# Patient Record
Sex: Male | Born: 1955 | Race: Black or African American | Hispanic: No | Smoking: Former smoker
Health system: Southern US, Community
[De-identification: ages and names within clinical notes are randomized; demographics above are authoritative.]

## PROBLEM LIST (undated history)

## (undated) DIAGNOSIS — M545 Low back pain, unspecified: Secondary | ICD-10-CM

## (undated) DIAGNOSIS — K219 Gastro-esophageal reflux disease without esophagitis: Secondary | ICD-10-CM

## (undated) DIAGNOSIS — I10 Essential (primary) hypertension: Secondary | ICD-10-CM

## (undated) DIAGNOSIS — G4730 Sleep apnea, unspecified: Secondary | ICD-10-CM

## (undated) DIAGNOSIS — E785 Hyperlipidemia, unspecified: Secondary | ICD-10-CM

## (undated) DIAGNOSIS — G500 Trigeminal neuralgia: Secondary | ICD-10-CM

## (undated) HISTORY — PX: TONSILLECTOMY AND ADENOIDECTOMY: SUR1326

## (undated) HISTORY — DX: Sleep apnea, unspecified: G47.30

## (undated) HISTORY — DX: Essential (primary) hypertension: I10

## (undated) HISTORY — DX: Trigeminal neuralgia: G50.0

## (undated) HISTORY — DX: Hyperlipidemia, unspecified: E78.5

## (undated) HISTORY — DX: Low back pain, unspecified: M54.50

## (undated) HISTORY — DX: Gastro-esophageal reflux disease without esophagitis: K21.9

## (undated) HISTORY — DX: Low back pain: M54.5

## (undated) HISTORY — PX: HIATAL HERNIA REPAIR: SHX195

---

## 2001-08-26 ENCOUNTER — Inpatient Hospital Stay (HOSPITAL_COMMUNITY): Admission: EM | Admit: 2001-08-26 | Discharge: 2001-08-29 | Payer: Self-pay | Admitting: Emergency Medicine

## 2001-09-05 ENCOUNTER — Encounter: Admission: RE | Admit: 2001-09-05 | Discharge: 2001-12-04 | Payer: Self-pay | Admitting: Internal Medicine

## 2004-12-17 ENCOUNTER — Ambulatory Visit (HOSPITAL_COMMUNITY): Admission: RE | Admit: 2004-12-17 | Discharge: 2004-12-17 | Payer: Self-pay | Admitting: *Deleted

## 2006-10-06 ENCOUNTER — Encounter: Admission: RE | Admit: 2006-10-06 | Discharge: 2006-10-06 | Payer: Self-pay | Admitting: Internal Medicine

## 2007-06-03 ENCOUNTER — Encounter: Admission: RE | Admit: 2007-06-03 | Discharge: 2007-06-03 | Payer: Self-pay | Admitting: Internal Medicine

## 2007-08-13 ENCOUNTER — Encounter: Admission: RE | Admit: 2007-08-13 | Discharge: 2007-08-13 | Payer: Self-pay

## 2009-01-14 ENCOUNTER — Ambulatory Visit (HOSPITAL_COMMUNITY): Admission: RE | Admit: 2009-01-14 | Discharge: 2009-01-14 | Payer: Self-pay | Admitting: Cardiovascular Disease

## 2009-09-03 ENCOUNTER — Inpatient Hospital Stay (HOSPITAL_COMMUNITY): Admission: EM | Admit: 2009-09-03 | Discharge: 2009-09-04 | Payer: Self-pay | Admitting: Emergency Medicine

## 2010-03-22 ENCOUNTER — Encounter: Admission: RE | Admit: 2010-03-22 | Discharge: 2010-03-22 | Payer: Self-pay | Admitting: Gastroenterology

## 2010-07-27 ENCOUNTER — Ambulatory Visit (HOSPITAL_COMMUNITY)
Admission: RE | Admit: 2010-07-27 | Discharge: 2010-07-27 | Payer: Self-pay | Source: Home / Self Care | Attending: Gastroenterology | Admitting: Gastroenterology

## 2010-08-27 ENCOUNTER — Other Ambulatory Visit (HOSPITAL_COMMUNITY): Payer: Self-pay | Admitting: Surgery

## 2010-08-27 DIAGNOSIS — E1143 Type 2 diabetes mellitus with diabetic autonomic (poly)neuropathy: Secondary | ICD-10-CM

## 2010-08-27 DIAGNOSIS — K3184 Gastroparesis: Secondary | ICD-10-CM

## 2010-08-27 DIAGNOSIS — R1110 Vomiting, unspecified: Secondary | ICD-10-CM

## 2010-08-27 DIAGNOSIS — IMO0001 Reserved for inherently not codable concepts without codable children: Secondary | ICD-10-CM

## 2010-09-09 ENCOUNTER — Other Ambulatory Visit (HOSPITAL_COMMUNITY): Payer: Self-pay

## 2010-09-22 ENCOUNTER — Encounter (HOSPITAL_COMMUNITY)
Admission: RE | Admit: 2010-09-22 | Discharge: 2010-09-22 | Disposition: A | Discharge status: Home / Self Care | Payer: BC Managed Care – PPO | Source: Ambulatory Visit | Attending: Surgery | Admitting: Surgery

## 2010-09-22 DIAGNOSIS — IMO0001 Reserved for inherently not codable concepts without codable children: Secondary | ICD-10-CM

## 2010-09-22 DIAGNOSIS — E1149 Type 2 diabetes mellitus with other diabetic neurological complication: Secondary | ICD-10-CM | POA: Insufficient documentation

## 2010-09-22 DIAGNOSIS — E1143 Type 2 diabetes mellitus with diabetic autonomic (poly)neuropathy: Secondary | ICD-10-CM

## 2010-09-22 DIAGNOSIS — K219 Gastro-esophageal reflux disease without esophagitis: Secondary | ICD-10-CM | POA: Insufficient documentation

## 2010-09-22 DIAGNOSIS — R1110 Vomiting, unspecified: Secondary | ICD-10-CM

## 2010-09-22 DIAGNOSIS — K3184 Gastroparesis: Secondary | ICD-10-CM

## 2010-09-22 MED ORDER — TECHNETIUM TC 99M SULFUR COLLOID
2.0000 | Freq: Once | INTRAVENOUS | Status: AC | PRN
  Administered 2010-09-22: 2 via INTRAVENOUS

## 2010-09-27 LAB — HEMOGLOBIN A1C
Hgb A1c MFr Bld: 8.5 % — ABNORMAL HIGH (ref 4.6–6.1)
Mean Plasma Glucose: 197 mg/dL

## 2010-09-27 LAB — CBC
HCT: 44.1 % (ref 39.0–52.0)
HCT: 47.9 % (ref 39.0–52.0)
Hemoglobin: 14.5 g/dL (ref 13.0–17.0)
Hemoglobin: 15.5 g/dL (ref 13.0–17.0)
MCHC: 32.3 g/dL (ref 30.0–36.0)
MCHC: 32.8 g/dL (ref 30.0–36.0)
MCV: 80.8 fL (ref 78.0–100.0)
MCV: 81.6 fL (ref 78.0–100.0)
Platelets: 142 10*3/uL — ABNORMAL LOW (ref 150–400)
Platelets: 144 10*3/uL — ABNORMAL LOW (ref 150–400)
RBC: 5.41 MIL/uL (ref 4.22–5.81)
RBC: 5.93 MIL/uL — ABNORMAL HIGH (ref 4.22–5.81)
RDW: 13.3 % (ref 11.5–15.5)
RDW: 13.3 % (ref 11.5–15.5)
WBC: 4.5 10*3/uL (ref 4.0–10.5)
WBC: 5 10*3/uL (ref 4.0–10.5)

## 2010-09-27 LAB — GLUCOSE, CAPILLARY
Glucose-Capillary: 182 mg/dL — ABNORMAL HIGH (ref 70–99)
Glucose-Capillary: 200 mg/dL — ABNORMAL HIGH (ref 70–99)
Glucose-Capillary: 224 mg/dL — ABNORMAL HIGH (ref 70–99)
Glucose-Capillary: 256 mg/dL — ABNORMAL HIGH (ref 70–99)
Glucose-Capillary: 70 mg/dL (ref 70–99)

## 2010-09-27 LAB — MAGNESIUM: Magnesium: 2.1 mg/dL (ref 1.5–2.5)

## 2010-09-27 LAB — LIPID PANEL
Cholesterol: 156 mg/dL (ref 0–200)
HDL: 40 mg/dL (ref >39)
LDL Cholesterol: 88 mg/dL (ref 0–99)
Total CHOL/HDL Ratio: 3.9 RATIO
Triglycerides: 142 mg/dL (ref <150)
VLDL: 28 mg/dL (ref 0–40)

## 2010-09-27 LAB — DIFFERENTIAL
Basophils Absolute: 0 10*3/uL (ref 0.0–0.1)
Basophils Relative: 1 % (ref 0–1)
Eosinophils Absolute: 0 10*3/uL (ref 0.0–0.7)
Eosinophils Relative: 1 % (ref 0–5)
Lymphocytes Relative: 37 % (ref 12–46)
Lymphs Abs: 1.7 10*3/uL (ref 0.7–4.0)
Monocytes Absolute: 0.5 10*3/uL (ref 0.1–1.0)
Monocytes Relative: 11 % (ref 3–12)
Neutro Abs: 2.3 10*3/uL (ref 1.7–7.7)
Neutrophils Relative %: 50 % (ref 43–77)

## 2010-09-27 LAB — CARDIAC PANEL(CRET KIN+CKTOT+MB+TROPI)
CK, MB: 4.3 ng/mL — ABNORMAL HIGH (ref 0.3–4.0)
CK, MB: 4.8 ng/mL — ABNORMAL HIGH (ref 0.3–4.0)
Relative Index: 1.5 (ref 0.0–2.5)
Relative Index: 1.6 (ref 0.0–2.5)
Total CK: 267 U/L — ABNORMAL HIGH (ref 7–232)
Total CK: 317 U/L — ABNORMAL HIGH (ref 7–232)
Troponin I: 0.01 ng/mL (ref 0.00–0.06)
Troponin I: <0.01 ng/mL (ref 0.00–0.06)

## 2010-09-27 LAB — BASIC METABOLIC PANEL
BUN: 10 mg/dL (ref 6–23)
CO2: 29 mEq/L (ref 19–32)
Calcium: 9.4 mg/dL (ref 8.4–10.5)
Chloride: 102 mEq/L (ref 96–112)
Creatinine, Ser: 0.94 mg/dL (ref 0.4–1.5)
GFR calc Af Amer: >60 mL/min (ref >60)
GFR calc non Af Amer: >60 mL/min (ref >60)
Glucose, Bld: 149 mg/dL — ABNORMAL HIGH (ref 70–99)
Potassium: 3.8 mEq/L (ref 3.5–5.1)
Sodium: 137 mEq/L (ref 135–145)

## 2010-09-27 LAB — CK TOTAL AND CKMB (NOT AT ARMC)
CK, MB: 5.6 ng/mL — ABNORMAL HIGH (ref 0.3–4.0)
Relative Index: 1.5 (ref 0.0–2.5)
Total CK: 377 U/L — ABNORMAL HIGH (ref 7–232)

## 2010-09-27 LAB — COMPREHENSIVE METABOLIC PANEL
ALT: 45 U/L (ref 0–53)
AST: 27 U/L (ref 0–37)
Albumin: 4.3 g/dL (ref 3.5–5.2)
Alkaline Phosphatase: 77 U/L (ref 39–117)
BUN: 8 mg/dL (ref 6–23)
CO2: 27 mEq/L (ref 19–32)
Calcium: 9.5 mg/dL (ref 8.4–10.5)
Chloride: 105 mEq/L (ref 96–112)
Creatinine, Ser: 0.92 mg/dL (ref 0.4–1.5)
GFR calc Af Amer: >60 mL/min (ref >60)
GFR calc non Af Amer: >60 mL/min (ref >60)
Glucose, Bld: 98 mg/dL (ref 70–99)
Potassium: 3.7 mEq/L (ref 3.5–5.1)
Sodium: 141 mEq/L (ref 135–145)
Total Bilirubin: 0.7 mg/dL (ref 0.3–1.2)
Total Protein: 7.3 g/dL (ref 6.0–8.3)

## 2010-09-27 LAB — PROTIME-INR
INR: 0.99 (ref 0.00–1.49)
Prothrombin Time: 13 seconds (ref 11.6–15.2)

## 2010-09-27 LAB — TROPONIN I: Troponin I: 0.01 ng/mL (ref 0.00–0.06)

## 2010-09-27 LAB — HEPARIN LEVEL (UNFRACTIONATED)
Heparin Unfractionated: 0.26 IU/mL — ABNORMAL LOW (ref 0.30–0.70)
Heparin Unfractionated: 0.31 IU/mL (ref 0.30–0.70)

## 2010-09-27 LAB — POCT CARDIAC MARKERS
CKMB, poc: 2.9 ng/mL (ref 1.0–8.0)
Myoglobin, poc: 124 ng/mL (ref 12–200)
Troponin i, poc: <0.05 ng/mL (ref 0.00–0.09)

## 2010-09-27 LAB — MRSA PCR SCREENING: MRSA by PCR: NEGATIVE

## 2010-09-27 LAB — TSH: TSH: 1.993 u[IU]/mL (ref 0.350–4.500)

## 2010-09-27 LAB — APTT: aPTT: 35 seconds (ref 24–37)

## 2010-11-02 ENCOUNTER — Encounter (INDEPENDENT_AMBULATORY_CARE_PROVIDER_SITE_OTHER): Payer: Self-pay | Admitting: Surgery

## 2010-11-15 ENCOUNTER — Ambulatory Visit (HOSPITAL_COMMUNITY): Admission: RE | Admit: 2010-11-15 | Payer: BC Managed Care – PPO | Source: Ambulatory Visit | Admitting: Surgery

## 2010-11-19 NOTE — Op Note (Signed)
[name], [name] NO.:  [id number]   MEDICAL RECORD NO.:  [id number]          PATIENT TYPE:  AMB   LOCATION:  ENDO                         FACILITY:  [facility]   PHYSICIAN:  Georgiana Spinner, M.D.    DATE OF BIRTH:  [birth year]   DATE OF PROCEDURE:  12/17/2004  DATE OF DISCHARGE:                                 OPERATIVE REPORT   PROCEDURE:  Colonoscopy.   INDICATIONS:  Rectal bleeding   ANESTHESIA:  Demerol 60, Versed 6 mg.   PROCEDURE:  With the patient mildly sedated in the left lateral decubitus  position, the Olympus videoscopic colonoscope was inserted in the rectum and  after normal rectal exam and passed under direct vision to the cecum,  identified by the base of the cecum and ileocecal valve.  Of note, the  patient had a fair amount of fecal debris that could not be suctioned  because it was particulate, and we rolled the patient from one side to the  other to look at the cecum and move the debris which we did.  Subsequently,  the colonoscope was then slowly withdrawn taking circumferential views of  colonic mucosa, stopping to suction and clean as we went until we reached  the rectum which appeared normal on direct and retroflexed view.  The  endoscope was straightened, withdrawn.  The patient's vital signs, pulse  oximeter remained stable.  The patient tolerated procedure well without  apparent complications.   FINDINGS:  Unremarkable examination limited somewhat by the prep but no  gross lesions seen.   PLAN:  Consider repeat examination possibly in 5 years.       GMO/MEDQ  D:  12/17/2004  T:  12/17/2004  Job:  [id number]

## 2010-11-19 NOTE — H&P (Signed)
[address]  Patient:    [name], [place] [name] Visit Number: [id number] MRN: [id number]          Service Type: MED Location: 1E 0104 01 Attending Physician:  Devoria Albe Dictated by:   Soyla Murphy. Renne Crigler, M.D. Admit Date:  08/26/2001   CC:         Janae Bridgeman. Eloise Harman., M.D.   History and Physical  DATE OF BIRTH:  1955-11-10  ADMISSION HISTORY AND PHYSICAL:  [place]ard [name] is a 55 year old single resident of [place] admitted with a chief complaint of weakness.  He said that it started about a week ago.  He felt exhausted and was drinking a lot of fluids.  He was seen in the office by Victorino Dike on Thursday, he was told to come back and have labs on Friday and did not hear the results from those.  Yesterday he felt terrible.  Today he was no better so he came to the emergency room.  He has had no cough, fevers, chills, dysuria, or syncope.  PAST HISTORY:  Hypertension.  He has not had surgery.  MEDICATIONS: 1. Cardura one tablet q.a.m., 1-1/2 tablets q.p.m. 2. Prevacid 30 mg p.o. daily. 3. Multivitamin one daily. 4. Ibuprofen p.r.n. 5. Zocor unknown amount q.p.m.  ALLERGIES:  No known allergies.  PERSONAL HISTORY:  Originally from [place].  He works as a Science writer.  He has an occasional beer on weekends but no tobacco.  He quit smoking three years ago.  FAMILY HISTORY:  There is no diabetes in the family.  Father died of "natural causes."  Mother is alive and well, two children alive and well.  REVIEW OF SYSTEMS:  He has no current numbness, tingling, or weakness, seizures, syncope, headache, sinus pain, ear pain, change in hearing, or change in vision.  There is no runny nose or sore throat.  No trouble swallowing.  No nausea, vomiting, diarrhea, constipation, blood in stool, or black stools.  He denies any chest pain, cough, or shortness of breath, although he states he does tire more easily than usual.  He has had no  change in skin and no temperature intolerance.  There has been no change in urination and he has had excessive urination for the past week but no burning with urination and no blood has been noted.  No abdominal pain.  No joint or muscle aches.  The vision has been blurry at times.  He did have a little rectal bleeding a month or two ago.  PHYSICAL EXAMINATION:  GENERAL:  He is a thin-appearing gentleman in no acute distress.  VITAL SIGNS:  Blood pressure is 116/82, pulse 107, respirations 18, temperature is 96.9.  SKIN:  Within normal limits.  NECK:  There is no cervical, supraclavicular, axillary, or inguinal adenopathy.  Neck is supple.  There is no thyromegaly and no neck masses.  HEENT:  Normocephalic, atraumatic, conjunctivae normal, pupils small, round, reactive to light, equal.  TMs and ear canals are normal.  Tongue and posterior pharynx appear normal.  LUNGS:  Clear.  HEART:  Regular rate and rhythm.  No murmurs, rubs, or gallops.  No JVD, no edema.  ABDOMEN:  Nontender, bowel sounds present, no organomegaly, no masses.  GENITALIA:  Normal circumcised male genitalia with normal testicles.  No hernias.  RECTAL:  Normal rectal tone, no rectal masses.  Stools brown and Heme-negative.  Prostate feels normal.  EXTREMITIES:  No cyanosis, clubbing, or edema.  Good muscle mass.  NEUROLOGIC:  Alert,  oriented x3, cranial nerves II-XII intact.  Equal muscle strength in both lower extremities including EHL with 1+ equal deep tendon reflexes in knees.  Normal speech.  Gait is not tested at this time.  IMPRESSION: 1. New onset diabetes with diabetic ketoacidosis.  Will treat with hydration    and place on _________  to get blood sugars down.  Adjust IV fluid as    indicated.  Diabetic teaching while in hospital. 2. New, recent rectal bleeding.  Recommended GI consult to _________ patient. 3. Hypertension.  Change Cardura to Altace and for health maintenance will    check  PPD and give him a Pneumovax and check HIV. Dictated by:   Soyla Murphy. Renne Crigler, M.D. Attending Physician:  Devoria Albe DD:  08/26/01 TD:  08/26/01 Job: [id number]

## 2010-11-19 NOTE — Discharge Summary (Signed)
[facility]  Patient:    Howard [name], Howard [name] Visit Number: [id number] MRN: [id number]          Service Type: MED Location: 3W 0380 01 Attending Physician:  Vashti Hey Dictated by:   Janae Bridgeman Eloise Harman., M.D. Admit Date:  08/26/2001 Discharge Date: 08/29/2001                             Discharge Summary  DATE OF BIRTH:  12/31/1955  FINAL DIAGNOSES: 1. Diabetic ketoacidosis with new onset diabetes mellitus. 2. Hypertension. 3. Gastroesophageal reflux disease. 4. Hyperlipidemia.  HISTORY OF PRESENT ILLNESS:  This 55 year old black male, [name] by birth, presents with about one week of increasing fatigue, polydipsia, polyuria, malaise, about a 25 to 30 pound weight loss over the past two to three months. He had presented to the office just prior to this presentation with these complaints, and laboratory studies were drawn, but not yet returned for assessment.  On presentation to the emergency room the patients blood sugar was found to be in excess at 600, and he had some ketonuria.  He was promptly admitted to the intensive care unit for aggressive management of this acute onset of diabetic ketoacidosis.  HOSPITAL COURSE:  The patient was initially placed on an insulin drip via the glucomander protocol.  He had prompt response, and his capillary blood sugar the following morning was 160 mg%.  Insulin was continued by sliding scale, and initiated with NPH in the morning.  Subsequently, Lantus was added at bedtime, and the NPH in the morning was switched to 75/25 Humalog.  He was educated by the diabetic educator.  He had no untoward complications initially, and was subsequently transferred out of the intensive care unit to the regular floor for further management, hydration, and diabetic teaching. By the morning of 08/29/01, he was still receiving diabetic instruction, however, it was felt that further monitoring and treatment could  be accomplished safely on an outpatient basis.  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  He will check his records and see if he has previously received BCG vaccine in his [name], otherwise, he may be a candidate for [facility] for the next six months.  A pneumovax vaccine was ordered, however, it is not clear if this was administered at the time of this dictation.  LABORATORY DATA:  The patients blood sugar in the emergency room was 624. Sodium was 127, potassium 5.5, chloride 93, CO2 16 in the emergency room. This dictation is not being very cooperative, I cannot get the machinery here to work.  Subsequent sodiums were up to 137, potassium remained normal at 3.7, CO2 was steadily rising up to 21.  Glucose steadily decreasing in the range of 156 to 270.  His initial bilirubin was slightly elevated at 1.6.  Serum acetone initially was small.  His urine initially showed urine with greater than 1000 mg% of glucose, greater than 80 mg% of ketones.  His EKG showed a normal sinus rhythm with left atrial enlargement, possibly early repolarization abnormality.  CONSULTATIONS:  None.  OPERATIONS:  None.  TRANSFUSIONS:  None.  RECOMMENDATIONS ON DISCHARGE:  The patient was advised that he indeed was a new onset diabetic, and that for the foreseeable future he would need insulin self-administered and monitored closely in the office.  He would receive some Lantus insulin 20 units at bedtime, and a Humalog mixture 75/25  in the morning, 30 units unless otherwise directed.  He would check his capillary glucose readings every mealtime, and administer his sliding scale according to protocol.  FURTHER DISCHARGE MEDICATIONS: 1. His usual acid blocker, Prevacid 30 mg q.d. 2. Multivitamin q.d. 3. Aspirin 81 mg q.d. 4. He is also started on Altace 5 mg q.d.  He would monitor his capillary glucose before meals and bedtime, until his return to the office in about  10 days.  ACTIVITY:  Remain out of work until this next Monday.  Return to work if he is physically able.  FOLLOWUP:  Return for further diabetic teaching through the diabetic teaching service as an outpatient.  CONDITION ON DISCHARGE:  Greatly improved.  DIET:  He will follow a 2400 calorie diabetic diet.Dictated by:   Janae Bridgeman Eloise Harman., M.D. Attending Physician:  Vashti Hey DD:  08/29/01 TD:  08/29/01 Job: [id number]

## 2011-01-16 ENCOUNTER — Emergency Department (HOSPITAL_COMMUNITY)
Admission: EM | Admit: 2011-01-16 | Discharge: 2011-01-16 | Disposition: A | Discharge status: Home / Self Care | Payer: BC Managed Care – PPO | Attending: Emergency Medicine | Admitting: Emergency Medicine

## 2011-01-16 ENCOUNTER — Emergency Department (HOSPITAL_COMMUNITY): Payer: BC Managed Care – PPO

## 2011-01-16 DIAGNOSIS — I10 Essential (primary) hypertension: Secondary | ICD-10-CM | POA: Insufficient documentation

## 2011-01-16 DIAGNOSIS — M5412 Radiculopathy, cervical region: Secondary | ICD-10-CM | POA: Insufficient documentation

## 2011-01-16 DIAGNOSIS — IMO0002 Reserved for concepts with insufficient information to code with codable children: Secondary | ICD-10-CM | POA: Insufficient documentation

## 2011-01-16 DIAGNOSIS — E119 Type 2 diabetes mellitus without complications: Secondary | ICD-10-CM | POA: Insufficient documentation

## 2011-01-16 DIAGNOSIS — F411 Generalized anxiety disorder: Secondary | ICD-10-CM | POA: Insufficient documentation

## 2011-01-17 ENCOUNTER — Other Ambulatory Visit: Payer: Self-pay | Admitting: Physical Medicine and Rehabilitation

## 2011-01-17 DIAGNOSIS — M5030 Other cervical disc degeneration, unspecified cervical region: Secondary | ICD-10-CM

## 2011-01-18 ENCOUNTER — Other Ambulatory Visit: Payer: Self-pay | Admitting: Physical Medicine and Rehabilitation

## 2011-01-18 DIAGNOSIS — M5030 Other cervical disc degeneration, unspecified cervical region: Secondary | ICD-10-CM

## 2011-01-19 ENCOUNTER — Ambulatory Visit
Admission: RE | Admit: 2011-01-19 | Discharge: 2011-01-19 | Disposition: A | Discharge status: Home / Self Care | Payer: BC Managed Care – PPO | Source: Ambulatory Visit | Attending: Physical Medicine and Rehabilitation | Admitting: Physical Medicine and Rehabilitation

## 2011-01-19 ENCOUNTER — Other Ambulatory Visit: Payer: Self-pay

## 2011-01-19 DIAGNOSIS — M5030 Other cervical disc degeneration, unspecified cervical region: Secondary | ICD-10-CM

## 2011-01-19 MED ORDER — METHYLPREDNISOLONE ACETATE 40 MG/ML INJ SUSP (RADIOLOG
120.0000 mg | Freq: Once | INTRAMUSCULAR | Status: AC
  Administered 2011-01-19: 120 mg via EPIDURAL

## 2011-01-19 MED ORDER — IOHEXOL 180 MG/ML  SOLN
1.0000 mL | Freq: Once | INTRAMUSCULAR | Status: AC | PRN
  Administered 2011-01-19: 1 mL via EPIDURAL

## 2011-02-17 ENCOUNTER — Ambulatory Visit (HOSPITAL_COMMUNITY)
Admission: RE | Admit: 2011-02-17 | Discharge: 2011-02-17 | Disposition: A | Discharge status: Home / Self Care | Payer: BC Managed Care – PPO | Source: Ambulatory Visit | Attending: Surgery | Admitting: Surgery

## 2011-02-17 ENCOUNTER — Other Ambulatory Visit (HOSPITAL_COMMUNITY): Payer: BC Managed Care – PPO

## 2011-02-17 ENCOUNTER — Telehealth (INDEPENDENT_AMBULATORY_CARE_PROVIDER_SITE_OTHER): Payer: Self-pay

## 2011-02-17 ENCOUNTER — Other Ambulatory Visit (INDEPENDENT_AMBULATORY_CARE_PROVIDER_SITE_OTHER): Payer: Self-pay | Admitting: Surgery

## 2011-02-17 ENCOUNTER — Encounter (HOSPITAL_COMMUNITY): Payer: BC Managed Care – PPO

## 2011-02-17 DIAGNOSIS — K449 Diaphragmatic hernia without obstruction or gangrene: Secondary | ICD-10-CM | POA: Insufficient documentation

## 2011-02-17 DIAGNOSIS — Z01818 Encounter for other preprocedural examination: Secondary | ICD-10-CM

## 2011-02-17 DIAGNOSIS — E119 Type 2 diabetes mellitus without complications: Secondary | ICD-10-CM | POA: Insufficient documentation

## 2011-02-17 DIAGNOSIS — Z01812 Encounter for preprocedural laboratory examination: Secondary | ICD-10-CM | POA: Insufficient documentation

## 2011-02-17 DIAGNOSIS — I10 Essential (primary) hypertension: Secondary | ICD-10-CM | POA: Insufficient documentation

## 2011-02-17 LAB — CBC
HCT: 47.2 % (ref 39.0–52.0)
Hemoglobin: 15.1 g/dL (ref 13.0–17.0)
MCH: 25.9 pg — ABNORMAL LOW (ref 26.0–34.0)
MCHC: 32 g/dL (ref 30.0–36.0)
MCV: 81.1 fL (ref 78.0–100.0)
Platelets: 150 10*3/uL (ref 150–400)
RBC: 5.82 MIL/uL — ABNORMAL HIGH (ref 4.22–5.81)
RDW: 13.4 % (ref 11.5–15.5)
WBC: 5.7 10*3/uL (ref 4.0–10.5)

## 2011-02-17 LAB — BASIC METABOLIC PANEL
BUN: 12 mg/dL (ref 6–23)
CO2: 28 mEq/L (ref 19–32)
Calcium: 10.1 mg/dL (ref 8.4–10.5)
Chloride: 103 mEq/L (ref 96–112)
Creatinine, Ser: 1 mg/dL (ref 0.50–1.35)
GFR calc Af Amer: >60 mL/min (ref >60)
GFR calc non Af Amer: >60 mL/min (ref >60)
Glucose, Bld: 204 mg/dL — ABNORMAL HIGH (ref 70–99)
Potassium: 4.1 mEq/L (ref 3.5–5.1)
Sodium: 140 mEq/L (ref 135–145)

## 2011-02-17 LAB — SURGICAL PCR SCREEN
MRSA, PCR: NEGATIVE
Staphylococcus aureus: NEGATIVE

## 2011-02-17 NOTE — Telephone Encounter (Signed)
Albin Felling called and stated she saw the pt for preop and he had just seen his primary md for a sinus infection or head cold.  He was started on a Z-pack.   He has no fever today.  Surgery is 8/23.  Albin Felling told him to call us if no better before surgery

## 2011-02-17 NOTE — Telephone Encounter (Signed)
Returned pt's message about starting a Z-Pack this wk for sinus infection and Dr Michaell Cowing notified. Pt advised if start to run fever or develop a cough he would need to let us know before his surgery on 02-24-11 b/c we might need to postpone surgery. The pt will let us know if any changes should occur but he is feeling much better being on the z-pack./ [facility]

## 2011-02-24 ENCOUNTER — Ambulatory Visit (HOSPITAL_COMMUNITY)
Admission: RE | Admit: 2011-02-24 | Discharge: 2011-02-25 | Disposition: A | Discharge status: Home / Self Care | Payer: BC Managed Care – PPO | Source: Ambulatory Visit | Attending: Surgery | Admitting: Surgery

## 2011-02-24 DIAGNOSIS — K2100 Gastro-esophageal reflux disease with esophagitis, without bleeding: Secondary | ICD-10-CM

## 2011-02-24 DIAGNOSIS — G4733 Obstructive sleep apnea (adult) (pediatric): Secondary | ICD-10-CM | POA: Insufficient documentation

## 2011-02-24 DIAGNOSIS — Z79899 Other long term (current) drug therapy: Secondary | ICD-10-CM | POA: Insufficient documentation

## 2011-02-24 DIAGNOSIS — Z794 Long term (current) use of insulin: Secondary | ICD-10-CM | POA: Insufficient documentation

## 2011-02-24 DIAGNOSIS — K449 Diaphragmatic hernia without obstruction or gangrene: Secondary | ICD-10-CM

## 2011-02-24 DIAGNOSIS — K219 Gastro-esophageal reflux disease without esophagitis: Secondary | ICD-10-CM | POA: Insufficient documentation

## 2011-02-24 DIAGNOSIS — E119 Type 2 diabetes mellitus without complications: Secondary | ICD-10-CM | POA: Insufficient documentation

## 2011-02-24 DIAGNOSIS — Z7982 Long term (current) use of aspirin: Secondary | ICD-10-CM | POA: Insufficient documentation

## 2011-02-24 DIAGNOSIS — I10 Essential (primary) hypertension: Secondary | ICD-10-CM | POA: Insufficient documentation

## 2011-02-24 HISTORY — PX: ESOPHAGOGASTRIC FUNDOPLICATION: SHX405

## 2011-02-24 LAB — GLUCOSE, CAPILLARY
Glucose-Capillary: 118 mg/dL — ABNORMAL HIGH (ref 70–99)
Glucose-Capillary: 178 mg/dL — ABNORMAL HIGH (ref 70–99)
Glucose-Capillary: 191 mg/dL — ABNORMAL HIGH (ref 70–99)
Glucose-Capillary: 237 mg/dL — ABNORMAL HIGH (ref 70–99)
Glucose-Capillary: 252 mg/dL — ABNORMAL HIGH (ref 70–99)

## 2011-02-25 ENCOUNTER — Ambulatory Visit (HOSPITAL_COMMUNITY): Payer: BC Managed Care – PPO

## 2011-02-25 LAB — GLUCOSE, CAPILLARY
Glucose-Capillary: 116 mg/dL — ABNORMAL HIGH (ref 70–99)
Glucose-Capillary: 202 mg/dL — ABNORMAL HIGH (ref 70–99)
Glucose-Capillary: 84 mg/dL (ref 70–99)

## 2011-02-25 MED ORDER — IOHEXOL 300 MG/ML  SOLN
50.0000 mL | Freq: Once | INTRAMUSCULAR | Status: AC | PRN
  Administered 2011-02-25: 50 mL via ORAL

## 2011-02-28 ENCOUNTER — Telehealth (INDEPENDENT_AMBULATORY_CARE_PROVIDER_SITE_OTHER): Payer: Self-pay | Admitting: Surgery

## 2011-02-28 LAB — GLUCOSE, CAPILLARY: Glucose-Capillary: 141 mg/dL — ABNORMAL HIGH (ref 70–99)

## 2011-03-03 NOTE — Op Note (Signed)
NAMEWILLIOM, Howard Cook NO.:  [id number]  MEDICAL RECORD NO.:  [id number]  LOCATION:  1538                         FACILITY:  [facility]  PHYSICIAN:  Ardeth Sportsman, MD     DATE OF BIRTH:  1956/02/03  DATE OF PROCEDURE:  02/24/2011 DATE OF DISCHARGE:                              OPERATIVE REPORT   PRIMARY CARE PHYSICIAN:  Georgianne Fick, M.D.  GASTROENTEROLOGIST:  Jordan Hawks. Elnoria Howard, M.D.  ENT SURGEON:  Kristine Garbe. Ezzard Standing, M.D.  PULMONOLOGIST:  Kerry Kass, M.D. [facility].  OPERATING SURGEON:  Ardeth Sportsman, MD.  ASSISTANT:  Sandria Bales. Ezzard Standing, M.D.  PREOPERATIVE DIAGNOSIS:  Worsening gastroesophageal reflux disease in the setting of a sliding paraesophageal hiatal hernia.  POSTOPERATIVE DIAGNOSIS:  Worsening gastroesophageal reflux disease in the setting of a sliding paraesophageal hiatal hernia.  PROCEDURE PERFORMED: 1. Laparoscopic type 2 mediastinal dissection. 2. Laparoscopic primary paraesophageal hiatal hernia repair with     pledgets. 3. Laparoscopic Nissen fundoplication (360-degree wrap, over 56-French     bougie, 2 cm length).  SPECIMENS:  None.  DRAINS:  None.  ESTIMATED BLOOD LOSS:  30 mL.  COMPLICATIONS:  None apparent.  INDICATIONS:  Mr. Stillinger is a 55 year old gentleman, originally from [place], but in the [place] for several decades who has had worsening problems with foul taste and odor, and allergic and ENT workup negative for any major disorders with resolved sinusitis.  He has evidence of reflux on a Bravo pH study and a hiatal hernia as well.  He has been on maximal medical therapy without palliation of his symptoms.  The anatomy and physiology of the foregut was discussed. Pathophysiology of the reflux including differential diagnosis and symptoms were discussed.  Recommendations made for fundoplication and repair of paraesophageal hiatal hernia.  Risks, benefits, alternatives were discussed in detail.  Postoperative diet  and other risks were discussed in detail.  I had a long discussion with him numerous times. He expressed understanding and wished to proceed.  OPERATIVE FINDINGS:  He had a somewhat inflamed shortened esophagus.  He had a sliding paraesophageal hiatal hernia without a massive hiatal defect.  The wrap was as noted above.  DESCRIPTION OF PROCEDURE:  Informed consent was confirmed.  The patient underwent general anesthesia without any difficulty.  He was positioned on a split leg with arms tucked.  A Foley catheter was sterilely placed. He had sequential compression devices active during the entire case. His abdomen and mons pubis were clipped, prepped, and draped in the sterile fashion.  A surgical time-out confirmed the plan.  He received IV cefazolin.  I placed a #5 mm port in the left upper quadrant, the paramedian region using optical entry technique with a 5 mm 0 scope.  Entry was clean.  I induced carbon dioxide insufflation.  Camera inspection revealed no injury.  Under direct visualization, I placed a #10 mm port in the left subcostal ridge near the lateral clavicular line.  I placed another 5 mm port in the right upper quadrant.  I placed a 5 mm port in the right subxiphoid region under direct visualization.  I removed that and placed a rigid omega-shaped Nathanson liver retractor to help secure  the left lateral sector of the liver anteriorly to better expose the esophageal hiatus.  It was secured to the table using an iron man system.  I placed a 5 mm port in the right subxiphoid region.  Inspection noted some mild inflammation in the region.  I went ahead and ligated the hepatogastric ligament using Harmonic ultrasonic dissection. I was able to follow that, I followed that, and I identified the right cruise.  I was able to enter between the right cruise and the esophagus in the anterior mediastinum.  With that, I could free the phrenoesophageal attachments from the apex  down to the base.  With that, I could help identify the posterior left cruise.  I was able to find a window between the stomach and the posterior left cruise to help free off.  We placed the esophagus on further axial tension.  I was able to come around the right cruise anteriorly to identify the medial border of left cruise at its apex.  I helped free the phrenoesophageal attachments in that fashion.  I helped free the fundus of the stomach and cardia off the left diaphragm as well.  We ligated the short gastric vessles starting about a third of the way down the greater curvature of the stomach following it around there and helped reflecting the body and fundus of the stomach off.  Freed of some retroperitoneal thin attachments as well.  The fundus was quite intimate with the upper pole of the spleen; but, after taking the  anterior & posterior folds, we were able to ligate that without any injury to spleen or bleeding.  Next, we placed the esophagus on axial tension and I did a type 2 mediastinal dissection going between the crura up in the mediastinum.  I mainly did this with some general axial stripping of thin avascular attachments.  I then did ultrasonic dissections close to the esophagus. He had good fettuccine-type vagal nerves anteriorly and posteriorly.  I could see those and kept those preserved at all times.  With gradual axial tension and going up about 12 cm in the mediastinum, I could help better mobilize the esophagus in good esophageal length.  Initially, the cardia was up a little bit at the mediastinum, but after doing this dissection, I could get about 4 cm of esophagus into the abdomen and off axial tension.  There were no breaches of pleura or esophagus.  It looked like a good clean mediastinal dissection.  I brought the fundus of the stomach behind the cardia over to the right side to help setup the right side of wrap and did a classic shoeshine maneuver.  He  did have a prominent epiphrenic pad on the left cardia and I skeletonized it, took it off to anyway from the anterior vagus.  With that, I could better see the anatomy and I could do good a shoeshine maneuver and could bring together the right and left sides of the upper stomach to form a good Nissen-type wrap.  We rolled the cardia and stomach laterally towards the spleen and explored the esophageal hiatus. I closed the esophageal hiatus using 0-Ethibond horizontal mattress stitches with pledgets on either side.  I did that with 3 interrupted stitches.  This helped closed the crura well and allowed that to be a little it snug around the esophagus, but still plenty of free space anteriorly.  I brought the wrap around on the right side and did a posterior gastropexy of the right posterior wrap  to the crura using a #1 Ethibond stitch interrupted x3.  I did the left anterior gastropexy using a 0-Ethibond stitch from the left superior side of the wrap to the apex of the left crura, taking a good healthy bite of fascia of the diaphragm there.  I tied that down.  That helped setup the wrap for completion.  Dr. Leta Jungling of Anesthesia was able to transorally pass a 56 cm French lit bougie and placed some general axial tension on the stomach and esophagus.  It came in easily without any resistance.  I did a 3-stitch Nissen fundoplication, taking a bite of the left side of the left anterior stomach, a bite of the esophagus, and a bite of the right anterior stomach.  I did have 3 interrupted stitches.  The wrap length was 2 cm.  We removed the bougie and it was intact.  I could easily get a blunt grasper up into the mediastinum with some loose approximation. It was not overly snug or tight.  I could get underneath the wrap for a good floppy wrap as well.  Hemostasis was excellent.  I removed the liver retractor under visualization.  I did take some Exparel, long- acting bupivacaine and did place 20 mL  of mildy diluted Exparel around the esophageal hiatus to help numb the closure and provide some good pain relief.  I avoided injecting into the diaphragm or the blood vessels themselves.  I did that under direct laparoscopic visualization. I removed all the ports.  The 10 mm ports laid above the subcostal ridge.  I did a little nerve block on the left intercostals near that port site to help numb that area as well.  I closed the skin using 4-0 Monocryl stitch.  Sterile dressings were applied.  The patient was extubated and taken to recovery room in stable condition.  I discussed the postoperative care and management of diet with the patient in the office and just prior to surgery.  I am about to discuss with his family as well who he said should be arriving right now.     Ardeth Sportsman, MD     SCG/MEDQ  D:  02/24/2011  T:  02/25/2011  Job:  [id number]  cc:   Georgianne Fick, M.D. Fax: [phone]  Jordan Hawks. Elnoria Howard, MD Fax: [phone]  Kristine Garbe. Ezzard Standing, M.D. Fax: [phone]  Kerry Kass, M.D. [facility] [address] [place] [address]  Electronically Signed by Karie Soda MD on 03/03/2011 01:18:46 PM

## 2011-03-09 ENCOUNTER — Ambulatory Visit (INDEPENDENT_AMBULATORY_CARE_PROVIDER_SITE_OTHER): Payer: BC Managed Care – PPO | Admitting: Surgery

## 2011-03-09 ENCOUNTER — Encounter (INDEPENDENT_AMBULATORY_CARE_PROVIDER_SITE_OTHER): Payer: Self-pay | Admitting: Surgery

## 2011-03-09 VITALS — BP 130/84 | HR 78 | Temp 96.4°F | Ht 71.0 in | Wt 180.2 lb

## 2011-03-09 DIAGNOSIS — K219 Gastro-esophageal reflux disease without esophagitis: Secondary | ICD-10-CM

## 2011-03-09 MED ORDER — PROMETHAZINE HCL 12.5 MG PO TABS: 12.5000 mg | ORAL_TABLET | Freq: Four times a day (QID) | ORAL | Status: DC | PRN

## 2011-03-09 NOTE — Progress Notes (Signed)
Subjective:     Patient ID: Howard Cook, male   DOB: May 27, 1956, 55 y.o.   MRN: [id number]  HPI  Resister visit: Followup after laparoscopic paraesophageal hiatal hernia repair and Nissen fundoplication 02/24/2011.  The patient comes in today feeling well. Some mild dysphasia. Tolerating mostly pured foods and liquids well. Off pain medicines. A little tired. Still has occasional problem of foul taste in the back the mouth. However, he notes it seems to be receeding. No reflux. Had one episode of nausea but went away. Difficulty getting the p.o. nausea med filled (?ondansetron) but wouldn't try suppositories.  No fevers chills or sweats  Past Medical History  Diagnosis Date  . Diabetes mellitus   . Hypertension   . Hyperlipidemia   . Chest pain   . LBP (low back pain)   . Acid reflux     Past Surgical History  Procedure Date  . Hiatal hernia repair [id number]    With NIssen Fundoplication   Current Outpatient Prescriptions on File Prior to Visit  Medication Sig Dispense Refill  . AmLODIPine Besylate (NORVASC PO) Take by mouth.        . ASPIRIN PO Take 80 mg by mouth.        . Dexlansoprazole (DEXILANT) 30 MG capsule Take 30 mg by mouth daily.        . Doxylamine Succinate, Sleep, (SLEEP AID PO) Take by mouth as needed. [employer]       . Insulin Aspart Prot & Aspart (NOVOLOG MIX 70/30 [place]) Inject 20-50 mg into the skin 3 (three) times daily.        . rosuvastatin (CRESTOR) 40 MG tablet Take 40 mg by mouth daily.        . sucralfate (CARAFATE) 1 G tablet Take 1 g by mouth 4 (four) times daily.           Review of Systems  Constitutional: Negative for fever, chills and diaphoresis.  HENT: Negative for nosebleeds, sore throat, facial swelling, mouth sores, trouble swallowing and ear discharge.   Eyes: Negative for photophobia, discharge and visual disturbance.  Respiratory: Negative for choking, chest tightness, shortness of breath and stridor.   Cardiovascular: Negative for  chest pain and palpitations.  Gastrointestinal: Negative for vomiting, abdominal pain, constipation, blood in stool, abdominal distention, anal bleeding and rectal pain.       Mild nausea.  Dysphagia to solids.  Pureed okay.  Belching a little.  Mild loose stools.  Genitourinary: Negative for dysuria, urgency, difficulty urinating and testicular pain.  Musculoskeletal: Negative for myalgias, back pain, arthralgias and gait problem.  Skin: Negative for color change, pallor, rash and wound.  Neurological: Negative for dizziness, speech difficulty, weakness, numbness and headaches.  Hematological: Negative for adenopathy. Does not bruise/bleed easily.  Psychiatric/Behavioral: Negative for hallucinations, confusion and agitation.       Objective:   Physical Exam  Constitutional: He is oriented to person, place, and time. He appears well-developed and well-nourished. No distress.  HENT:  Head: Normocephalic.  Mouth/Throat: Oropharynx is clear and moist. No oropharyngeal exudate.  Eyes: Conjunctivae and EOM are normal. Pupils are equal, round, and reactive to light. No scleral icterus.  Neck: Normal range of motion. Neck supple. No tracheal deviation present.  Cardiovascular: Normal rate, regular rhythm and intact distal pulses.   Pulmonary/Chest: Effort normal and breath sounds normal. No respiratory distress.  Abdominal: Soft. He exhibits no distension. There is no tenderness. Hernia confirmed negative in the right inguinal area and confirmed negative in  the left inguinal area.       Incisions closed   Musculoskeletal: Normal range of motion. He exhibits no tenderness.  Lymphadenopathy:    He has no cervical adenopathy.       Right: No inguinal adenopathy present.       Left: No inguinal adenopathy present.  Neurological: He is alert and oriented to person, place, and time. No cranial nerve deficit. He exhibits normal muscle tone. Coordination normal.  Skin: Skin is warm and dry. No rash  noted. He is not diaphoretic. No erythema. No pallor.  Psychiatric: He has a normal mood and affect. His behavior is normal. Judgment and thought content normal.       Assessment:     2 weeks status post laparoscopic repair of sliding paraesophageal hiatal hernia. Doing relatively well.    Plan:     Gradually advance diet to regular diet over the next few months.  Phenergan p.o. prescription sent. PR for backup.  Increase his activity.  Consider bowel regimen to avoid extremes of constipation or diarrhea.  Return to clinic 3 weeks. At that point can probably stop his PPI.  I noted he will take several months to get a sense if the foul taste will finally go away. I am encouraged if it's already becoming less of a problem only a few weeks out. He felt reassured.

## 2011-03-31 ENCOUNTER — Ambulatory Visit (INDEPENDENT_AMBULATORY_CARE_PROVIDER_SITE_OTHER): Payer: BC Managed Care – PPO | Admitting: Surgery

## 2011-03-31 ENCOUNTER — Encounter (INDEPENDENT_AMBULATORY_CARE_PROVIDER_SITE_OTHER): Payer: Self-pay | Admitting: Surgery

## 2011-03-31 VITALS — BP 124/88 | HR 72 | Temp 96.2°F | Resp 18 | Ht 71.0 in | Wt 177.1 lb

## 2011-03-31 DIAGNOSIS — R438 Other disturbances of smell and taste: Secondary | ICD-10-CM | POA: Insufficient documentation

## 2011-03-31 DIAGNOSIS — K219 Gastro-esophageal reflux disease without esophagitis: Secondary | ICD-10-CM

## 2011-03-31 DIAGNOSIS — R439 Unspecified disturbances of smell and taste: Secondary | ICD-10-CM

## 2011-03-31 DIAGNOSIS — K449 Diaphragmatic hernia without obstruction or gangrene: Secondary | ICD-10-CM

## 2011-03-31 DIAGNOSIS — Z8709 Personal history of other diseases of the respiratory system: Secondary | ICD-10-CM

## 2011-03-31 NOTE — Patient Instructions (Signed)
Stop antacids for now (Dexilant, sucralfate).  Advance to regular diet as toleraqted  OK to exercise.  Return in ~ one month.  Call if worse / not improving.  You may need to see ENT or GI again if symptoms do not resolve by 3 months post-op

## 2011-03-31 NOTE — Progress Notes (Signed)
Subjective:     Patient ID: Howard Cook, male   DOB: [birth year], 55 y.o.   MRN: [id number]  HPI   Resister visit: Followup after laparoscopic paraesophageal hiatal hernia repair and Nissen fundoplication 02/24/2011.  The patient comes in today feeling well. Some mild dysphasia. Tolerating mostly soft foods and liquids well. Still has occasional problem of foul odor/taste in the back the mouth. However, he notes only one episode for a few seconds in the past 2 weeks. No reflux.  No fevers chills or sweats.  Wants to exercise on treadmill, etc  Past Medical History  Diagnosis Date  . Diabetes mellitus   . Hypertension   . Hyperlipidemia   . Chest pain   . LBP (low back pain)   . Acid reflux     Past Surgical History  Procedure Date  . Hiatal hernia repair [id number]    With NIssen Fundoplication  . Esophagogastric fundoplication 02/24/11   Current Outpatient Prescriptions on File Prior to Visit  Medication Sig Dispense Refill  . AmLODIPine Besylate (NORVASC PO) Take by mouth.        . ASPIRIN PO Take 80 mg by mouth.        . Doxylamine Succinate, Sleep, (SLEEP AID PO) Take by mouth as needed. [employer]       . Insulin Aspart Prot & Aspart (NOVOLOG MIX 70/30 [place]) Inject 20-50 mg into the skin 3 (three) times daily.        . promethazine (PHENERGAN) 25 MG suppository Place 25 mg rectally every 6 (six) hours as needed.        . rosuvastatin (CRESTOR) 40 MG tablet Take 40 mg by mouth daily.           Review of Systems  Constitutional: Negative for fever, chills and diaphoresis.  HENT: Negative for nosebleeds, sore throat, facial swelling, mouth sores, trouble swallowing and ear discharge.   Eyes: Negative for photophobia, discharge and visual disturbance.  Respiratory: Negative for choking, chest tightness, shortness of breath and stridor.   Cardiovascular: Negative for chest pain and palpitations.  Gastrointestinal: Negative for vomiting, abdominal pain, constipation, blood in  stool, abdominal distention, anal bleeding and rectal pain.       Dysphagia to some solids.  Soft foods okay.  Belching okay.  Mild loose stools.  Genitourinary: Negative for dysuria, urgency, difficulty urinating and testicular pain.  Musculoskeletal: Negative for myalgias, back pain, arthralgias and gait problem.  Skin: Negative for color change, pallor, rash and wound.  Neurological: Negative for dizziness, speech difficulty, weakness, numbness and headaches.  Hematological: Negative for adenopathy. Does not bruise/bleed easily.  Psychiatric/Behavioral: Negative for hallucinations, confusion and agitation.       Objective:   Physical Exam  Constitutional: He is oriented to person, place, and time. He appears well-developed and well-nourished. No distress.  HENT:  Head: Normocephalic.  Mouth/Throat: Oropharynx is clear and moist. No oropharyngeal exudate.  Eyes: Conjunctivae and EOM are normal. Pupils are equal, round, and reactive to light. No scleral icterus.  Neck: Normal range of motion. Neck supple. No tracheal deviation present.  Cardiovascular: Normal rate, regular rhythm and intact distal pulses.   Pulmonary/Chest: Effort normal and breath sounds normal. No respiratory distress.  Abdominal: Soft. He exhibits no distension. There is no tenderness. Hernia confirmed negative in the right inguinal area and confirmed negative in the left inguinal area.       Incisions closed.  No hernias/pain   Musculoskeletal: Normal range of motion. He exhibits no tenderness.  Lymphadenopathy:    He has no cervical adenopathy.       Right: No inguinal adenopathy present.       Left: No inguinal adenopathy present.  Neurological: He is alert and oriented to person, place, and time. No cranial nerve deficit. He exhibits normal muscle tone. Coordination normal.  Skin: Skin is warm and dry. No rash noted. He is not diaphoretic. No erythema. No pallor.  Psychiatric: He has a normal mood and affect.  His behavior is normal. Judgment and thought content normal.       Assessment:     One month status post laparoscopic repair of sliding paraesophageal hiatal hernia, doing relatively well.    Plan:     Gradually advance diet to regular diet over the next few months.  See how he does off all GERD meds.  D/C PPI (Dexilant), Carafate.  Increase his activity.  Consider bowel regimen to avoid extremes of constipation or diarrhea.  Return to clinic one month. At that point can probably stop his PPI.  I noted he will take several months to get a sense if the foul taste will finally go away. I am encouraged if it's already becoming less of a problem only a few weeks out. He felt reassured.

## 2011-05-03 ENCOUNTER — Encounter (INDEPENDENT_AMBULATORY_CARE_PROVIDER_SITE_OTHER): Payer: Self-pay | Admitting: Surgery

## 2011-05-03 ENCOUNTER — Ambulatory Visit (INDEPENDENT_AMBULATORY_CARE_PROVIDER_SITE_OTHER): Payer: BC Managed Care – PPO | Admitting: Surgery

## 2011-05-03 DIAGNOSIS — Z8709 Personal history of other diseases of the respiratory system: Secondary | ICD-10-CM

## 2011-05-03 DIAGNOSIS — R438 Other disturbances of smell and taste: Secondary | ICD-10-CM

## 2011-05-03 DIAGNOSIS — R439 Unspecified disturbances of smell and taste: Secondary | ICD-10-CM

## 2011-05-03 MED ORDER — AMOXICILLIN-POT CLAVULANATE 875-125 MG PO TABS: 1.0000 | ORAL_TABLET | Freq: Two times a day (BID) | ORAL | Status: AC

## 2011-05-03 NOTE — Progress Notes (Signed)
Subjective:     Patient ID: Howard Cook, male   DOB: [birth year], 55 y.o.   MRN: [id number]  HPI  Patient Care Team: Georgianne Fick, MD as PCP - General (Internal Medicine) Theda Belfast as Consulting Physician (Gastroenterology) Drema Halon, MD as Consulting Physician (Otolaryngology) Susy Frizzle, MD as Consulting Physician (Otolaryngology)  This patient is a 55 y.o.male who presents today for surgical evaluation.   Procedure: Laparoscopic paraesophageal hiatal hernia repair with Nissen fundoplication 02/24/2011.  Reason for visit: Followup. Persistent bad odor/taste.  Patient denies any heartburn or reflux. He has been off all PPIs and Carafate. Mild dysphagia to solids but nearly resolved. He will sleep lying down flat. Able to belch. No episodes of nausea or retching.  He does note that he feels like the bad taste his back again. It's more of a mucus/acidic be taste. Not metallic. It is worse. He feels like he feels slight sliminess & fullness on the back of his palate. He thinks he gets occasional drainage. It occasional gets a little better with Benadryl. He's not on any antihistamine right now. He's been gargling and brushing his teeth BID. He sees his dentist regularly and denies any problems with that. No major sinus pain. His does feel sinus fullness. He does use a CPAP machine. He notes a few years ago he had it checked and there is no evidence of any abnormalities with that. No fevers. No chills. No sweats.    He feels a little frustrated but is appreciative that the reflux is under control.  He recalls getting allergy test by one of the Dr. Blossom Hoops. As it was dust mites.  He Has had his carpet shampooed and he keeps his carpet clean.  No pets.  No seasonal changes. No improvement with the cold weather.  He does feel like that the bad taste went away for the first 2 weeks after surgery. It started coming back before we stopped his PPIs. He noticed no difference with  the holding those medications.He seen a few otolaryngologists in town. He recalls having sinusitis on a CT one time. He was given trial and a box. The bad taste went away. CT showed resolution. However once a box that things came back. He's not really changed his medicines. No new medications.  No rashes no arthralgias no sores. No history of fungus infections. No sick contacts or travel history. No history of TB. He is originally from [place] but has lived here for over 22 years. The symptoms didn't start until about 4 years ago.  Past Medical History  Diagnosis Date  . Diabetes mellitus   . Hypertension   . Hyperlipidemia   . Chest pain   . LBP (low back pain)   . Acid reflux     Past Surgical History  Procedure Date  . Hiatal hernia repair [id number]    With NIssen Fundoplication  . Esophagogastric fundoplication 02/24/11    History   Social History  . Marital Status: Divorced    Spouse Name: N/A    Number of Children: N/A  . Years of Education: N/A   Occupational History  . Not on file.   Social History Main Topics  . Smoking status: Never Smoker   . Smokeless tobacco: Not on file  . Alcohol Use: Yes     WEEKENDS. ROUGHLY 4 BOTTLE BEER  . Drug Use: No  . Sexually Active:    Other Topics Concern  . Not on file   Social History  Narrative  . No narrative on file    Family History  Problem Relation Age of Onset  . Stroke Brother     Current outpatient prescriptions:AmLODIPine Besylate (NORVASC PO), Take by mouth.  , Disp: , Rfl: ;  ASPIRIN PO, Take 80 mg by mouth.  , Disp: , Rfl: ;  doxycycline (VIBRAMYCIN) 100 MG capsule, , Disp: , Rfl: ;  Doxylamine Succinate, Sleep, (SLEEP AID PO), Take by mouth as needed. [employer] , Disp: , Rfl: ;  Insulin Aspart Prot & Aspart (NOVOLOG MIX 70/30 [place]), Inject 20-50 mg into the skin 3 (three) times daily.  , Disp: , Rfl:  losartan (COZAAR) 100 MG tablet, , Disp: , Rfl: ;  promethazine (PHENERGAN) 25 MG suppository, Place 25 mg  rectally every 6 (six) hours as needed.  , Disp: , Rfl: ;  rosuvastatin (CRESTOR) 40 MG tablet, Take 40 mg by mouth daily.  , Disp: , Rfl: ;  VIAGRA 100 MG tablet, , Disp: , Rfl: ;  amoxicillin-clavulanate (AUGMENTIN) 875-125 MG per tablet, Take 1 tablet by mouth 2 (two) times daily., Disp: 28 tablet, Rfl: 3  No Known Allergies     Review of Systems  Constitutional: Negative for fever, chills and diaphoresis.  HENT: Positive for sinus pressure. Negative for hearing loss, ear pain, nosebleeds, congestion, sore throat, facial swelling, rhinorrhea, sneezing, drooling, mouth sores, trouble swallowing, neck pain, neck stiffness, dental problem, voice change, tinnitus and ear discharge.   Eyes: Negative for photophobia, discharge, itching and visual disturbance.  Respiratory: Negative for cough, choking, shortness of breath, wheezing and stridor.   Cardiovascular: Negative for chest pain, palpitations and leg swelling.  Gastrointestinal: Negative for nausea, vomiting, abdominal pain, diarrhea, constipation, blood in stool, abdominal distention, anal bleeding and rectal pain.       No personal nor family history of GI/colon cancer, inflammatory bowel disease, irritable bowel syndrome, allergy such as Celiac Sprue, dietary/dairy problems, colitis, ulcers nor gastritis.    No recent sick contacts/gastroenteritis.  No travel outside the country.  No changes in diet.  Mild dysphagia to solids - improving   Genitourinary: Negative for dysuria, urgency, flank pain, difficulty urinating and testicular pain.  Musculoskeletal: Negative for myalgias, arthralgias and gait problem.  Skin: Negative for color change and rash.  Neurological: Negative for dizziness, speech difficulty, weakness and numbness.  Hematological: Negative for adenopathy.  Psychiatric/Behavioral: Negative for hallucinations, confusion and agitation.       Objective:   Physical Exam  Constitutional: He is oriented to person, place,  and time. He appears well-developed and well-nourished. No distress.  HENT:  Head: Normocephalic. Head is without right periorbital erythema and without left periorbital erythema.  Right Ear: Hearing and external ear normal.  Left Ear: Hearing and external ear normal.  Nose: Mucosal edema present. No rhinorrhea, sinus tenderness or nasal deformity. No epistaxis. Right sinus exhibits no maxillary sinus tenderness and no frontal sinus tenderness. Left sinus exhibits no maxillary sinus tenderness and no frontal sinus tenderness.  Mouth/Throat: Normal dentition. Posterior oropharyngeal edema present. No posterior oropharyngeal erythema.  Eyes: Conjunctivae and EOM are normal. Pupils are equal, round, and reactive to light. No scleral icterus.  Neck: Normal range of motion. Neck supple. No JVD present. No tracheal deviation present. No thyromegaly present.  Cardiovascular: Normal rate, regular rhythm and intact distal pulses.   Pulmonary/Chest: Effort normal and breath sounds normal. No stridor. No respiratory distress.  Abdominal: Soft. He exhibits no distension and no mass. There is no tenderness. There is  no rebound and no guarding. Hernia confirmed negative in the right inguinal area and confirmed negative in the left inguinal area.  Musculoskeletal: Normal range of motion. He exhibits no tenderness.  Lymphadenopathy:    He has no cervical adenopathy.       Right: No inguinal adenopathy present.       Left: No inguinal adenopathy present.  Neurological: He is alert and oriented to person, place, and time. No cranial nerve deficit. He exhibits normal muscle tone. Coordination normal.  Skin: Skin is warm and dry. No rash noted. He is not diaphoretic. No erythema. No pallor.  Psychiatric: He has a normal mood and affect. His behavior is normal. Judgment and thought content normal.       Assessment:     Documented GERD by pH probe, resolved off all GERD meds  Returning bad taste of uncertain  etiology    Plan:     At this point, I would stay off his antireflux medications for now. Perhaps consider repeat pH if concerning. If there is documented reflux and restarted.  I discussed the case with Dr. Pollyann Kennedy. He and I thought a trial of Augmentin for a few weeks might be helpful. See Dr. Pollyann Kennedy to see if any further workup such as sinus endoscopy or repeat CT scan etc. is needed. The patient was interested in this.  Consider talking with the Dr. Nicholos Johns about stopping/switching all medications to rule out a side effect.  RTC PRN

## 2011-08-31 ENCOUNTER — Telehealth (INDEPENDENT_AMBULATORY_CARE_PROVIDER_SITE_OTHER): Payer: Self-pay

## 2011-08-31 DIAGNOSIS — Z8719 Personal history of other diseases of the digestive system: Secondary | ICD-10-CM

## 2011-08-31 DIAGNOSIS — R1310 Dysphagia, unspecified: Secondary | ICD-10-CM

## 2011-08-31 NOTE — Telephone Encounter (Signed)
Called pt back after I spoke to Dr Michaell Cowing about the pt having problems with food sticking. Per DR Michaell Cowing for the pt to go get a UGI. I scheduled an appt for the pt at [facility] on 09-07-11 arrive at 10:15am for 10:30am. The pt understands I will call him after we get the results.

## 2011-08-31 NOTE — Telephone Encounter (Signed)
Pt called stating he continues to have difficulty with food sticking when he eats. He states he is chewing very well but food still gets hung and has difficulty going down. When this occurs it causes him discomfort in his chest. This has been going on for 6 months. Pt waited to call hoping it would resolve. I advised pt I would send msg to Dr Michaell Cowing for recommendation.

## 2011-09-07 ENCOUNTER — Other Ambulatory Visit (HOSPITAL_COMMUNITY): Payer: BC Managed Care – PPO

## 2011-09-09 ENCOUNTER — Ambulatory Visit (HOSPITAL_COMMUNITY)
Admission: RE | Admit: 2011-09-09 | Discharge: 2011-09-09 | Disposition: A | Discharge status: Home / Self Care | Payer: BC Managed Care – PPO | Source: Ambulatory Visit | Attending: Surgery | Admitting: Surgery

## 2011-09-09 DIAGNOSIS — Z8719 Personal history of other diseases of the digestive system: Secondary | ICD-10-CM

## 2011-09-09 DIAGNOSIS — R1310 Dysphagia, unspecified: Secondary | ICD-10-CM | POA: Insufficient documentation

## 2011-09-09 DIAGNOSIS — K222 Esophageal obstruction: Secondary | ICD-10-CM | POA: Insufficient documentation

## 2011-09-09 DIAGNOSIS — Z9889 Other specified postprocedural states: Secondary | ICD-10-CM | POA: Insufficient documentation

## 2011-09-09 DIAGNOSIS — R112 Nausea with vomiting, unspecified: Secondary | ICD-10-CM | POA: Insufficient documentation

## 2011-09-10 NOTE — Progress Notes (Signed)
Quick Note:  Pt has distal esophageal stricture & could benefit from dilation of it by EGD. See if Dr. Elnoria Howard can see the patient about this. O/w I could do it. ______

## 2011-09-12 ENCOUNTER — Telehealth (INDEPENDENT_AMBULATORY_CARE_PROVIDER_SITE_OTHER): Payer: Self-pay

## 2011-09-12 NOTE — Telephone Encounter (Signed)
Called Dr Haywood Pao office to see if they would see the pt for esophageal stricture after surgery and their office made an appt for pt. The pt will see Dr Elnoria Howard on 09-14-11 arrive at 3:45p for 4:00p. The pt has been made aware of the appt.

## 2011-09-12 NOTE — Telephone Encounter (Signed)
LMOM at home and cell for pt to call me back so I can go over his test results.

## 2011-10-12 ENCOUNTER — Encounter (INDEPENDENT_AMBULATORY_CARE_PROVIDER_SITE_OTHER): Payer: Self-pay

## 2011-10-26 ENCOUNTER — Encounter: Payer: BC Managed Care – PPO | Attending: Internal Medicine | Admitting: *Deleted

## 2011-10-26 ENCOUNTER — Encounter: Payer: Self-pay | Admitting: *Deleted

## 2011-10-26 DIAGNOSIS — E119 Type 2 diabetes mellitus without complications: Secondary | ICD-10-CM | POA: Insufficient documentation

## 2011-10-26 DIAGNOSIS — Z713 Dietary counseling and surveillance: Secondary | ICD-10-CM | POA: Insufficient documentation

## 2011-10-26 NOTE — Progress Notes (Signed)
Medical Nutrition Therapy:  Appt start time: 1615  End time: 1715.  Assessment:  Primary concerns today: T2DM.  Howard Cook is here today for assessment of T2DM. Reports he was initially misdiagnosed in 2003, and actually has T1DM per Dr. Dorisann Frames. Pt reports previous DM education, but lacks knowledge of carb counting. Portions are likely main problem. Pt also reports elevated A1c d/t drinking regular Ensure to increase muscle mass. Was unaware of CHO content.   MEDICATIONS: See medication list. Pt reports discontinuation of novolog 70/30 and initiation of sliding scale with lantus at hs.    DIETARY INTAKE: Usual eating pattern includes 3 meals and 0-1 snacks per day.  24-hr recall: Pt reports good overall intake, though excessive portions of rice (a diet staple for him) and other CHO noted. Problem likely portion sizes rather than food choices.   Usual physical activity: Has a home gym which he uses 5-6 days/week.    Estimated energy needs: 1800-2000 calories 200-225 g carbohydrates 125-135 g protein 50-55 g fat  Progress Towards Goal(s):  In progress.   Nutritional Diagnosis:  [place]-2.1 Impaired nutrient utilization related to glucose metabolism as evidenced by recent A1c of 8.5% and MD referral for assessment and education.    Intervention/Goals:  Follow Diabetes Meal Plan as instructed (yellow card)  Measure portions of carbs at meals/snacks  Limit carbohydrate intake to 45-60 grams carbohydrate/meal  Limit carbohydrate intake to 15-20 grams carbohydrate/snack  Add lean protein foods to all meals/snacks  Monitor glucose levels as instructed by your doctor  Continue physical activity daily  Bring glucose log to your next nutrition visit   Handouts given during visit include:  Living Well with Diabetes - [employer]  Target Blood Glucose Levels  Monitoring/Evaluation:  Dietary intake, exercise, A1c, BG trends, and body weight in 6 week(s).

## 2011-10-26 NOTE — Patient Instructions (Signed)
Goals:  Follow Diabetes Meal Plan as instructed (yellow card)  Measure portions of carbs at meals/snacks  Limit carbohydrate intake to 45-60 grams carbohydrate/meal  Limit carbohydrate intake to 15-20 grams carbohydrate/snack  Add lean protein foods to all meals/snacks  Monitor glucose levels as instructed by your doctor  Continue physical activity daily  Bring glucose log to your next nutrition visit

## 2011-12-07 ENCOUNTER — Ambulatory Visit: Payer: BC Managed Care – PPO | Admitting: *Deleted

## 2015-03-08 ENCOUNTER — Other Ambulatory Visit: Payer: Self-pay | Admitting: Family Medicine

## 2015-03-08 ENCOUNTER — Ambulatory Visit (INDEPENDENT_AMBULATORY_CARE_PROVIDER_SITE_OTHER): Payer: BLUE CROSS/BLUE SHIELD

## 2015-03-08 ENCOUNTER — Ambulatory Visit (INDEPENDENT_AMBULATORY_CARE_PROVIDER_SITE_OTHER): Payer: BLUE CROSS/BLUE SHIELD | Admitting: Family Medicine

## 2015-03-08 VITALS — BP 112/68 | HR 76 | Temp 99.6°F | Resp 16 | Ht 71.0 in | Wt 177.0 lb

## 2015-03-08 DIAGNOSIS — J029 Acute pharyngitis, unspecified: Secondary | ICD-10-CM | POA: Diagnosis not present

## 2015-03-08 DIAGNOSIS — J301 Allergic rhinitis due to pollen: Secondary | ICD-10-CM | POA: Diagnosis not present

## 2015-03-08 DIAGNOSIS — E119 Type 2 diabetes mellitus without complications: Secondary | ICD-10-CM

## 2015-03-08 DIAGNOSIS — M79641 Pain in right hand: Secondary | ICD-10-CM

## 2015-03-08 DIAGNOSIS — H109 Unspecified conjunctivitis: Secondary | ICD-10-CM | POA: Diagnosis not present

## 2015-03-08 LAB — POCT RAPID STREP A (OFFICE): Rapid Strep A Screen: NEGATIVE

## 2015-03-08 MED ORDER — MELOXICAM 7.5 MG PO TABS: 7.5000 mg | ORAL_TABLET | Freq: Every day | ORAL | Status: DC

## 2015-03-08 MED ORDER — AMOXICILLIN 500 MG PO CAPS: 1000.0000 mg | ORAL_CAPSULE | Freq: Two times a day (BID) | ORAL | Status: DC

## 2015-03-08 NOTE — Patient Instructions (Signed)

## 2015-03-08 NOTE — Progress Notes (Addendum)
Subjective:  This chart was scribed for Howard Simmer, MD by [facility], medical scribe at [facility].The patient was seen in exam room 01 and the patient's care was started at 11:56 AM.   Patient ID: Howard Cook, male    DOB: 08/20/1955, 59 y.o.   MRN: [id number]  03/08/2015  Eye Problem; Sore Throat; Post nasal drip; Hand Pain; and Chills  HPI HPI Comments: Howard Cook is a 59 y.o. male who presents to [facility] for multiple complaints. Two days ago he developed a right eye discharge and eye redness. Eye drops/Visine for relief. He has also had a headache, sweats, generalized sore throat, post nasal drip with an odor, and a minor cough. Subjective fever and chills on Thursday. Taking ibuprofen and Nyquil for relief. Similar symptoms roughly 1-2 months ago, given a Z-pak for relief, but symptoms did return. No sick contacts. Denies nasal congestion, rhinorrhea, nausea, vomiting, rash or ear pain. Diarrhea earlier in the week but resolved.  Also complains of a right hand pain a the 5th digit intermittent and radiates through his hand. Ongoing for a month or more but gradually worsening. Pain when forming a fist.  Pain mostly in IP region of fifth digit but does radiate into hand.    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 Hx of diabetes, HTN, HLD Dr. Nicholos Johns is his PCP.   Review of Systems  Constitutional: Positive for fever, chills and diaphoresis. Negative for activity change, appetite change and fatigue.  HENT: Positive for congestion, postnasal drip, sneezing and sore throat. Negative for ear pain, rhinorrhea and sinus pressure.   Eyes: Positive for discharge and redness.  Respiratory: Negative for cough and shortness of breath.   Cardiovascular: Negative for chest pain, palpitations and leg swelling.  Gastrointestinal: Negative for nausea, vomiting, abdominal pain and diarrhea.  Endocrine: Negative for cold intolerance, heat intolerance,  polydipsia, polyphagia and polyuria.  Musculoskeletal: Positive for myalgias and arthralgias. Negative for joint swelling.  Skin: Negative for color change, rash and wound.  Neurological: Positive for headaches. Negative for dizziness, tremors, seizures, syncope, facial asymmetry, speech difficulty, weakness, light-headedness and numbness.  Psychiatric/Behavioral: Negative for sleep disturbance and dysphoric mood. The patient is not nervous/anxious.     Past Medical History  Diagnosis Date  . Hypertension   . Hyperlipidemia   . Chest pain   . LBP (low back pain)   . Acid reflux   . Diabetes mellitus 2003    Pt reports a corrected diagnosis to Type 1 DM (from T2DM) by Dr. Dorisann Frames 3 years ago  . Sleep apnea    Past Surgical History  Procedure Laterality Date  . Hiatal hernia repair  23Aug2012    With NIssen Fundoplication  . Esophagogastric fundoplication  02/24/11   No Known Allergies Social History   Social History  . Marital Status: Divorced    Spouse Name: N/A  . Number of Children: N/A  . Years of Education: N/A   Occupational History  . Not on file.   Social History Main Topics  . Smoking status: Former Smoker    Types: Cigarettes    Quit date: 10/26/1998  . Smokeless tobacco: Not on file  . Alcohol Use: Yes     Comment: WEEKENDS. ROUGHLY 4-5 BOTTLE BEER  . Drug Use: No  . Sexual Activity: Not on file   Other Topics Concern  . Not on file   Social History Narrative       Objective:  BP 112/68 mmHg  Pulse 76  Temp(Src) 99.6 F (37.6 C) (Oral)  Resp 16  Ht 5\' 11"  (1.803 m)  Wt 177 lb (80.287 kg)  BMI 24.70 kg/m2  SpO2 99% Physical Exam  Constitutional: He is oriented to person, place, and time. He appears well-developed and well-nourished. No distress.  HENT:  Head: Normocephalic and atraumatic.  Right Ear: Tympanic membrane, external ear and ear canal normal.  Left Ear: Tympanic membrane, external ear and ear canal normal.  Nose: Nose  normal.  Mouth/Throat: Mucous membranes are normal. No oropharyngeal exudate, posterior oropharyngeal edema or posterior oropharyngeal erythema.  Throat was mildly erythematous.  Eyes: Conjunctivae and EOM are normal. Pupils are equal, round, and reactive to light. Right eye exhibits discharge. Right eye exhibits no hordeolum. Left eye exhibits no hordeolum. Right conjunctiva has no hemorrhage. Left conjunctiva has no hemorrhage.  Eyes mildly injected, yellow discharge from right eye.  Neck: Normal range of motion. Neck supple. Carotid bruit is not present. No thyromegaly present.  Anterior cervical adenopathy.  Cardiovascular: Normal rate, regular rhythm, normal heart sounds and intact distal pulses.  Exam reveals no gallop and no friction rub.   No murmur heard. Pulmonary/Chest: Effort normal and breath sounds normal. He has no wheezes. He has no rales.  Abdominal: Soft. Bowel sounds are normal. He exhibits no distension and no mass. There is no tenderness. There is no rebound and no guarding.  Musculoskeletal:       Right wrist: Normal. He exhibits normal range of motion and no tenderness.       Right hand: He exhibits tenderness. He exhibits normal range of motion and no swelling. Normal sensation noted. Normal strength noted.  Mild swelling of the right PIP joint of the 5th joint. No triggering; almost normal flexion at PIP but mild flexion of joint persists.  Lymphadenopathy:    He has cervical adenopathy.  Neurological: He is alert and oriented to person, place, and time. No cranial nerve deficit.  Skin: Skin is warm and dry. No rash noted. He is not diaphoretic.  Psychiatric: He has a normal mood and affect. His behavior is normal.  Nursing note and vitals reviewed.  Results for orders placed or performed in visit on 03/08/15  POCT rapid strep A  Result Value Ref Range   Rapid Strep A Screen Negative Negative     [facility] reading (PRIMARY) by  Dr. Katrinka Blazing.  R HAND: degenerative changes  PIP region.   Assessment & Plan:   1. Right hand pain   2. Sore throat   3. Bilateral conjunctivitis   4. Type 2 diabetes mellitus without complication   5. Allergic rhinitis due to pollen    1.  R hand pain: New Consistent with arthritic etiology; rx for Meloxicam 7.5mg  daily PRN provided; passive range of motion; supportive care with rest, avoid repetitive lifting or overuse of R hand. 2.  Sore throat with conjunctivitis: New. Consistent with viral etiology; send throat culture; supportive care with rest, fluids, gargles; if no improvement in 5 days, start Amoxicillin. 3.  B conjunctivitis: New.  Associated with sore throat; consistent with viral etiology; benign exam in office.  Supportive care.  4.  DMII: controlled; monitor sugars closely with acute infection. 5.  Allergic Rhinitis: Uncontrolled; recommend restarting Zyrtec 10mg  daily.   Orders Placed This Encounter  Procedures  . Culture, Group A Strep  . DG Hand Complete Right    Standing Status: Future     Number of Occurrences: 1  Standing Expiration Date: 03/07/2016    Order Specific Question:  Reason for Exam (SYMPTOM  OR DIAGNOSIS REQUIRED)    Answer:  r hand pain 5th finger and metacarpal region; no swelling; no trauma    Order Specific Question:  Preferred imaging location?    Answer:  External  . POCT rapid strep A    Meds ordered this encounter  Medications  . canagliflozin (INVOKANA) 100 MG TABS tablet    Sig: Take 100 mg by mouth daily.  . meloxicam (MOBIC) 7.5 MG tablet    Sig: Take 1 tablet (7.5 mg total) by mouth daily.    Dispense:  30 tablet    Refill:  0  . amoxicillin (AMOXIL) 500 MG capsule    Sig: Take 2 capsules (1,000 mg total) by mouth 2 (two) times daily.    Dispense:  40 capsule    Refill:  0    Return if symptoms worsen or fail to improve.  I personally performed the services described in this documentation, which was scribed in my presence. The recorded information has been reviewed  and considered.  Mekenna Finau Paulita Fujita, M.D. [facility] [address] [place], [place]  [address] [phone] phone [phone] fax

## 2015-03-09 LAB — CULTURE, GROUP A STREP: Organism ID, Bacteria: NORMAL

## 2015-04-08 ENCOUNTER — Ambulatory Visit (INDEPENDENT_AMBULATORY_CARE_PROVIDER_SITE_OTHER): Payer: BLUE CROSS/BLUE SHIELD

## 2015-04-08 ENCOUNTER — Telehealth: Payer: Self-pay

## 2015-04-08 ENCOUNTER — Ambulatory Visit (INDEPENDENT_AMBULATORY_CARE_PROVIDER_SITE_OTHER): Payer: BLUE CROSS/BLUE SHIELD | Admitting: Emergency Medicine

## 2015-04-08 VITALS — BP 118/82 | HR 76 | Temp 98.6°F | Resp 18 | Ht 71.0 in | Wt 175.6 lb

## 2015-04-08 DIAGNOSIS — M542 Cervicalgia: Secondary | ICD-10-CM | POA: Diagnosis not present

## 2015-04-08 DIAGNOSIS — M5412 Radiculopathy, cervical region: Secondary | ICD-10-CM

## 2015-04-08 DIAGNOSIS — E139 Other specified diabetes mellitus without complications: Secondary | ICD-10-CM | POA: Diagnosis not present

## 2015-04-08 DIAGNOSIS — E119 Type 2 diabetes mellitus without complications: Secondary | ICD-10-CM | POA: Diagnosis not present

## 2015-04-08 LAB — GLUCOSE, POCT (MANUAL RESULT ENTRY): POC Glucose: 79 mg/dl (ref 70–99)

## 2015-04-08 MED ORDER — HYDROCODONE-ACETAMINOPHEN 5-325 MG PO TABS: 1.0000 | ORAL_TABLET | Freq: Four times a day (QID) | ORAL | Status: DC | PRN

## 2015-04-08 MED ORDER — PREDNISONE 10 MG PO TABS
ORAL_TABLET | ORAL | Status: DC
Start: 2015-04-08 — End: 2019-06-26
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
## 2015-04-08 NOTE — Patient Instructions (Signed)

## 2015-04-08 NOTE — Progress Notes (Signed)
   Subjective:    Patient ID: Howard Cook, male    DOB: 06-24-1956, 59 y.o.   MRN: [id number]  HPI    Review of Systems     Objective:   Physical Exam        Assessment & Plan:

## 2015-04-08 NOTE — Telephone Encounter (Signed)
Called pt and informed him that his glucose is 79. Advised him to get a snack since his glucose is low for him being on insulin.

## 2015-04-28 ENCOUNTER — Telehealth: Payer: Self-pay | Admitting: Radiology

## 2015-04-28 NOTE — Telephone Encounter (Signed)
At the patient's request, I made a copy of his x ray on CD and placed in the pick up drawer.

## 2017-05-09 ENCOUNTER — Other Ambulatory Visit (INDEPENDENT_AMBULATORY_CARE_PROVIDER_SITE_OTHER): Payer: Self-pay | Admitting: Otolaryngology

## 2017-05-09 DIAGNOSIS — R1310 Dysphagia, unspecified: Secondary | ICD-10-CM

## 2017-05-15 ENCOUNTER — Ambulatory Visit (HOSPITAL_COMMUNITY): Payer: Self-pay

## 2017-05-22 ENCOUNTER — Ambulatory Visit (HOSPITAL_COMMUNITY)
Admission: RE | Admit: 2017-05-22 | Discharge: 2017-05-22 | Disposition: A | Discharge status: Home / Self Care | Payer: Managed Care, Other (non HMO) | Source: Ambulatory Visit | Attending: Otolaryngology | Admitting: Otolaryngology

## 2017-05-22 DIAGNOSIS — R1310 Dysphagia, unspecified: Secondary | ICD-10-CM

## 2017-05-22 DIAGNOSIS — K229 Disease of esophagus, unspecified: Secondary | ICD-10-CM | POA: Diagnosis not present

## 2017-05-22 DIAGNOSIS — K222 Esophageal obstruction: Secondary | ICD-10-CM | POA: Diagnosis not present

## 2019-06-26 ENCOUNTER — Other Ambulatory Visit: Payer: Self-pay

## 2019-06-26 ENCOUNTER — Ambulatory Visit (INDEPENDENT_AMBULATORY_CARE_PROVIDER_SITE_OTHER): Payer: Managed Care, Other (non HMO)

## 2019-06-26 ENCOUNTER — Ambulatory Visit (HOSPITAL_COMMUNITY)
Admission: EM | Admit: 2019-06-26 | Discharge: 2019-06-26 | Disposition: A | Discharge status: Home / Self Care | Payer: Managed Care, Other (non HMO) | Attending: Emergency Medicine | Admitting: Emergency Medicine

## 2019-06-26 ENCOUNTER — Encounter (HOSPITAL_COMMUNITY): Payer: Self-pay | Admitting: Emergency Medicine

## 2019-06-26 DIAGNOSIS — R070 Pain in throat: Secondary | ICD-10-CM | POA: Diagnosis not present

## 2019-06-26 MED ORDER — ALUM & MAG HYDROXIDE-SIMETH 200-200-20 MG/5ML PO SUSP
30.0000 mL | Freq: Once | ORAL | Status: AC
  Administered 2019-06-26: 30 mL via ORAL

## 2019-06-26 MED ORDER — IBUPROFEN 600 MG PO TABS: 600.0000 mg | ORAL_TABLET | Freq: Four times a day (QID) | ORAL | 0 refills | Status: DC | PRN

## 2019-06-26 MED ORDER — LIDOCAINE VISCOUS HCL 2 % MT SOLN
15.0000 mL | Freq: Once | OROMUCOSAL | Status: AC
  Administered 2019-06-26: 15 mL via OROMUCOSAL

## 2019-06-26 MED ORDER — LIDOCAINE VISCOUS HCL 2 % MT SOLN
OROMUCOSAL | Status: AC
  Filled 2019-06-26: qty 15

## 2019-06-26 MED ORDER — HYDROCODONE-ACETAMINOPHEN 5-325 MG PO TABS: 1.0000 | ORAL_TABLET | Freq: Four times a day (QID) | ORAL | 0 refills | Status: DC | PRN

## 2019-06-26 MED ORDER — DEXAMETHASONE SODIUM PHOSPHATE 10 MG/ML IJ SOLN
10.0000 mg | Freq: Once | INTRAMUSCULAR | Status: AC
  Administered 2019-06-26: 17:00:00 10 mg via INTRAMUSCULAR

## 2019-06-26 MED ORDER — ALUM & MAG HYDROXIDE-SIMETH 200-200-20 MG/5ML PO SUSP
ORAL | Status: AC
  Filled 2019-06-26: qty 30

## 2019-06-26 MED ORDER — DEXAMETHASONE SODIUM PHOSPHATE 10 MG/ML IJ SOLN
INTRAMUSCULAR | Status: AC
  Filled 2019-06-26: qty 1

## 2019-06-26 NOTE — Discharge Instructions (Addendum)
5 mL of Benadryl mixed with 5 mL of Maalox.  Take this every 6 hours.  Benadryl will help with the inflammation, Maalox will coat your throat.  You may take 600 mg ibuprofen with a Tylenol containing product 3 or 4 times a day as needed for pain.  Ibuprofen with 1000 mg of Tylenol for mild to moderate pain, ibuprofen with 1-2 Norco for severe pain.  Go immediately to the ER if you start drooling, if you cannot breathe, voice changes, pain not controlled with the ibuprofen/Norco or other concerns.  Follow-up with your ENT doctor tomorrow.

## 2019-06-26 NOTE — ED Triage Notes (Signed)
Pt reports he was eating a fish about an hour ago and believes he swallowed a fish bone that's lodged in throat... hurts to swallow  Denies SOB/dyspnea  Pt is A&O x4... NAD.Marland Kitchen. ambulatory ... talking in complete sentecnes

## 2019-06-26 NOTE — ED Provider Notes (Signed)
HPI  SUBJECTIVE:  Howard Cook is a 63 y.o. male who presents with a swallowed fishbone that he is concerned is "stuck in his throat".  This occurred 1-1/2 to 2 hours prior to evaluation.  He describes sharp, constant pain.  States that he feels as if his throat is swollen, but denies sensation of swelling shut.  No drooling, voice changes, chest pain, shortness of breath.  He reports a "little difficulty" breathing because of the swelling.  He also reports neck pain with flexion extension.  He tried removing it manually with his finger, drinking water without improvement in symptoms.  Symptoms are worse with swallowing.  He has a past medical history of diabetes, hypertension.  JJH:ERDEYCXKGYJE, Campbell Lerner, MD   Past Medical History:  Diagnosis Date  . Acid reflux   . Chest pain   . Diabetes mellitus 2003   Pt reports a corrected diagnosis to Type 1 DM (from T2DM) by Dr. Dorisann Frames 3 years ago  . Hyperlipidemia   . Hypertension   . LBP (low back pain)   . Sleep apnea     Past Surgical History:  Procedure Laterality Date  . ESOPHAGOGASTRIC FUNDOPLICATION  02/24/11  . HIATAL HERNIA REPAIR  23Aug2012   With NIssen Fundoplication    Family History  Problem Relation Age of Onset  . Stroke Brother     Social History   Tobacco Use  . Smoking status: Former Smoker    Types: Cigarettes    Quit date: 10/26/1998    Years since quitting: 20.6  . Smokeless tobacco: Never Used  Substance Use Topics  . Alcohol use: Yes    Comment: WEEKENDS. ROUGHLY 4-5 BOTTLE BEER  . Drug use: No    No current facility-administered medications for this encounter.  Current Outpatient Medications:  .  AmLODIPine Besylate (NORVASC PO), Take by mouth.  , Disp: , Rfl:  .  ASPIRIN PO, Take 80 mg by mouth.  , Disp: , Rfl:  .  canagliflozin (INVOKANA) 100 MG TABS tablet, Take 100 mg by mouth daily., Disp: , Rfl:  .  HYDROcodone-acetaminophen (NORCO/VICODIN) 5-325 MG tablet, Take 1-2 tablets by mouth every  6 (six) hours as needed for moderate pain or severe pain., Disp: 12 tablet, Rfl: 0 .  ibuprofen (ADVIL) 600 MG tablet, Take 1 tablet (600 mg total) by mouth every 6 (six) hours as needed., Disp: 30 tablet, Rfl: 0 .  insulin aspart (NOVOLOG) 100 UNIT/ML injection, Inject into the skin daily. Sliding Scale:  <100: 0 units 100-150: 2 units 150-200: 3 units 200-250: 4 units >250: 5 units, Disp: , Rfl:  .  Insulin Aspart Prot & Aspart (NOVOLOG MIX 70/30 [place]), Inject 20-50 mg into the skin 3 (three) times daily.  , Disp: , Rfl:  .  insulin glargine (LANTUS) 100 UNIT/ML injection, Inject 5 Units into the skin at bedtime., Disp: , Rfl:  .  losartan (COZAAR) 100 MG tablet, , Disp: , Rfl:  .  Multiple Vitamin (MULTIVITAMIN) capsule, Take 1 capsule by mouth daily., Disp: , Rfl:  .  rosuvastatin (CRESTOR) 40 MG tablet, Take 40 mg by mouth daily.  , Disp: , Rfl:  .  VIAGRA 100 MG tablet, , Disp: , Rfl:   No Known Allergies   ROS  As noted in HPI.   Physical Exam  BP 131/76 (BP Location: Right Arm)   Pulse 88   Temp 98.4 F (36.9 C) (Oral)   Resp 14   SpO2 98%   Constitutional: Well developed,  well nourished, appears anxious, uncomfortable Eyes:  EOMI, conjunctiva normal bilaterally HENT: Normocephalic, atraumatic,mucus membranes moist.  No foreign body seen in oropharynx.  No drooling, stridor.  Tolerating secretions.  Pain with neck flexion.  No pain with neck extension. Respiratory: Normal inspiratory effort, lungs clear bilaterally good air movement Cardiovascular: Normal rate GI: nondistended skin: No rash, skin intact Musculoskeletal: no deformities Neurologic: Alert & oriented x 3, no focal neuro deficits Psychiatric: Speech and behavior appropriate   ED Course   Medications  dexamethasone (DECADRON) injection 10 mg (10 mg Intramuscular Given 06/26/19 1649)  lidocaine (XYLOCAINE) 2 % viscous mouth solution 15 mL (15 mLs Mouth/Throat Given 06/26/19 1652)  alum & mag  hydroxide-simeth (MAALOX/MYLANTA) 200-200-20 MG/5ML suspension 30 mL (30 mLs Oral Given 06/26/19 1652)    Orders Placed This Encounter  Procedures  . DG Neck Soft Tissue    Standing Status:   Standing    Number of Occurrences:   1    Order Specific Question:   Reason for Exam (SYMPTOM  OR DIAGNOSIS REQUIRED)    Answer:   swallowed fishbone r/o FB    No results found for this or any previous visit (from the past 24 hour(s)). DG Neck Soft Tissue  Result Date: 06/26/2019 CLINICAL DATA:  63 year old male with possible foreign object. Patient swallowed a fish bone. EXAM: NECK SOFT TISSUES - 1+ VIEW COMPARISON:  Cervical spine radiograph dated 04/08/2015. FINDINGS: There is no evidence of retropharyngeal soft tissue swelling or epiglottic enlargement. A 5 mm curvilinear density over the proximal esophagus appears similar to prior radiograph, likely an area of calcification. No radiopaque foreign object identified. Multilevel degenerative changes of the spine. The visualized airways are patent. IMPRESSION: No radiopaque foreign object. Electronically Signed   By: Anner Crete M.D.   On: 06/26/2019 17:29    ED Clinical Impression  1. Throat pain      ED Assessment/Plan  Appears uncomfortable.  No drooling, trismus, stridor, voice changes, no evidence of impending airway obstruction.  Will get lateral neck x-ray and give 10 mg of dexamethasone IM,  lidocaine/Benadryl/Maalox mixture.  Reviewed imaging independently.  No foreign body noted.  See radiology report for full details.  [facility] narcotic database reviewed.  No opiate prescriptions since 2019 feel that it is appropriate to prescribe short course of Norco.  On reevaluation, patient states that he feels the same.  No real change in his pain, however he is handling his secretions, there is no drooling or stridor.  Suspect that he has an abrasion from the swallowed fishbone.  We will have him continue Benadryl/Maalox mixture,  can try ibuprofen 600 mg, Norco for severe pain.  states that he will call his ENT doctor, Dr. Benjamine Mola tomorrow.  He is to go to the ER if symptoms get worse.  Discussed  imaging, MDM, treatment plan, and plan for follow-up with patient. Discussed sn/sx that should prompt return to the ED. patient agrees with plan.   Meds ordered this encounter  Medications  . dexamethasone (DECADRON) injection 10 mg  . lidocaine (XYLOCAINE) 2 % viscous mouth solution 15 mL  . alum & mag hydroxide-simeth (MAALOX/MYLANTA) 200-200-20 MG/5ML suspension 30 mL  . ibuprofen (ADVIL) 600 MG tablet    Sig: Take 1 tablet (600 mg total) by mouth every 6 (six) hours as needed.    Dispense:  30 tablet    Refill:  0  . HYDROcodone-acetaminophen (NORCO/VICODIN) 5-325 MG tablet    Sig: Take 1-2 tablets by mouth every 6 (  six) hours as needed for moderate pain or severe pain.    Dispense:  12 tablet    Refill:  0    *This clinic note was created using Scientist, clinical (histocompatibility and immunogenetics)Dragon dictation software. Therefore, there may be occasional mistakes despite careful proofreading.   ?    Domenick GongMortenson, Anayla Giannetti, MD 06/28/19 [phone]

## 2020-06-05 ENCOUNTER — Ambulatory Visit
Admission: EM | Admit: 2020-06-05 | Discharge: 2020-06-05 | Disposition: A | Discharge status: Home / Self Care | Payer: Managed Care, Other (non HMO)

## 2020-06-05 DIAGNOSIS — T887XXA Unspecified adverse effect of drug or medicament, initial encounter: Secondary | ICD-10-CM | POA: Diagnosis not present

## 2020-06-05 DIAGNOSIS — R202 Paresthesia of skin: Secondary | ICD-10-CM

## 2020-06-05 NOTE — Discharge Instructions (Addendum)
I have attached information regarding gabapentin and medication side effects.  Please review and discuss further with your primary care provider as I suspect this is the source of the symptoms that you are experiencing over the last week.

## 2020-06-05 NOTE — ED Triage Notes (Signed)
Pt c/o lt sided facial numbness/tingling and lt arm heaviness off and on for over a week. No facial droop noted.

## 2020-06-05 NOTE — ED Provider Notes (Signed)
[facility]    CSN: [id number] Arrival date & time: 06/05/20  1557      History   Chief Complaint Chief Complaint  Patient presents with  . Numbness    HPI Ezekiel Menzer is a 64 y.o. male.   HPI  Feels like a "feather is tapping his head". No pain. Prior to onset of symptom, orthopedic specialist prescribed Gabapentin daily and oxycodone PRN for chronic back pain. No visual changes or eye pressure, weakness, or dizziness.  Past Medical History:  Diagnosis Date  . Acid reflux   . Chest pain   . Diabetes mellitus 2003   Pt reports a corrected diagnosis to Type 1 DM (from T2DM) by Dr. Dorisann Frames 3 years ago  . Hyperlipidemia   . Hypertension   . LBP (low back pain)   . Sleep apnea     Patient Active Problem List   Diagnosis Date Noted  . 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 Bad oral taste 03/31/2011  . History of sinusitis 03/31/2011    Past Surgical History:  Procedure Laterality Date  . ESOPHAGOGASTRIC FUNDOPLICATION  02/24/11  . HIATAL HERNIA REPAIR  23Aug2012   With NIssen Fundoplication       Home Medications    Prior to Admission medications   Medication Sig Start Date End Date Taking? Authorizing Provider  AmLODIPine Besylate (NORVASC PO) Take by mouth.      [provider]  ASPIRIN PO Take 80 mg by mouth.      [provider]  canagliflozin (INVOKANA) 100 MG TABS tablet Take 100 mg by mouth daily.    [provider]  ibuprofen (ADVIL) 600 MG tablet Take 1 tablet (600 mg total) by mouth every 6 (six) hours as needed. 06/26/19   Domenick Gong, MD  insulin aspart (NOVOLOG) 100 UNIT/ML injection Inject into the skin daily. Sliding Scale:  <100: 0 units 100-150: 2 units 150-200: 3 units 200-250: 4 units >250: 5 units    Balan, Bindubal, MD  Insulin Aspart Prot & Aspart (NOVOLOG MIX 70/30 ) Inject 20-50 mg into the skin 3 (three) times daily.      [provider]   insulin glargine (LANTUS) 100 UNIT/ML injection Inject 5 Units into the skin at bedtime.    [provider]  losartan (COZAAR) 100 MG tablet  01/09/11   [provider]  Multiple Vitamin (MULTIVITAMIN) capsule Take 1 capsule by mouth daily.    [provider]  rosuvastatin (CRESTOR) 40 MG tablet Take 40 mg by mouth daily.      [provider]  VIAGRA 100 MG tablet  12/24/10   [provider]  promethazine (PHENERGAN) 12.5 MG tablet Take 1-2 tablets (12.5-25 mg total) by mouth every 6 (six) hours as needed for nausea. 03/09/11 06/26/19  Karie Soda, MD    Family History Family History  Problem Relation Age of Onset  . Stroke Brother     Social History Social History   Tobacco Use  . Smoking status: Former Smoker    Types: Cigarettes    Quit date: 10/26/1998    Years since quitting: 21.6  . Smokeless tobacco: Never Used  Substance Use Topics  . Alcohol use: Yes    Comment: WEEKENDS. ROUGHLY 4-5 BOTTLE BEER  . Drug use: No     Allergies   Patient has no known allergies.  Review of Systems Review of Systems Pertinent negatives listed in HPI   Physical Exam  Triage Vital Signs ED Triage Vitals  Enc Vitals Group     BP 06/05/20 1616 125/71     Pulse Rate 06/05/20 1616 78     Resp 06/05/20 1616 18     Temp 06/05/20 1616 98.6 F (37 C)     Temp Source 06/05/20 1616 Oral     SpO2 06/05/20 1616 94 %     Weight --      Height --      Head Circumference --      Peak Flow --      Pain Score 06/05/20 1617 0     Pain Loc --      Pain Edu? --      Excl. in GC? --    No data found.  Updated Vital Signs BP 125/71 (BP Location: Right Arm)   Pulse 78   Temp 98.6 F (37 C) (Oral)   Resp 18   SpO2 94%   Visual Acuity Right Eye Distance:   Left Eye Distance:   Bilateral Distance:    Right Eye Near:   Left Eye Near:    Bilateral Near:     Physical Exam Constitutional:      Appearance: Normal appearance.  Cardiovascular:      Rate and Rhythm: Normal rate and regular rhythm.  Musculoskeletal:        General: Normal range of motion.     Cervical back: Normal range of motion and neck supple.  Skin:    General: Skin is warm.  Neurological:     General: No focal deficit present.     Mental Status: He is alert and oriented to person, place, and time.  Psychiatric:        Mood and Affect: Mood normal.        Behavior: Behavior normal.        Thought Content: Thought content normal.        Judgment: Judgment normal.    Physical Exam: Constitutional: Patient appears well-developed and well-nourished. No distress. HENT: Normocephalic, atraumatic, External right and left ear normal. Oropharynx is clear and moist.  Eyes: Conjunctivae and EOM are normal. PERRLA, no scleral icterus. Neck: Normal ROM. Neck supple. No JVD. No tracheal deviation. No thyromegaly. CVS: RRR, S1/S2 +, no murmurs, no gallops, no carotid bruit.  Pulmonary: Effort and breath sounds normal, no stridor, rhonchi, wheezes, rales.  Abdominal: Soft. BS +, no distension, tenderness, rebound or guarding.  Musculoskeletal: Normal range of motion. No edema and no tenderness.  Lymphadenopathy: No lymphadenopathy noted, cervical, inguinal or axillary Neuro: Alert. Normal reflexes, muscle tone coordination. No cranial nerve deficit. Skin: Skin is warm and dry. No rash noted. Not diaphoretic. No erythema. No pallor. Psychiatric: Normal mood and affect. Behavior, judgment, thought content normal.  UC Treatments / Results  Labs (all labs ordered are listed, but only abnormal results are displayed) Labs Reviewed - No data to display  EKG   Radiology No results found.  Procedures Procedures (including critical care time)  Medications Ordered in UC Medications - No data to display  Initial Impression / Assessment and Plan / UC Course  I have reviewed the triage vital signs and the nursing notes.  Pertinent labs & imaging results that were  available during my care of the patient were reviewed by me and considered in my medical decision making (see chart for details).    Neurological exam grossly intact. Suspect facial sensation  Is related to newly started Gabapentin . Advised to follow-up with  prescriber. Ensure he is not taking medication together with other sedative medication . Red flags discussed. Patient verbalized understanding and agreement with plan. Final Clinical Impressions(s) / UC Diagnoses   Final diagnoses:  Side effect of medication  Paresthesia, left face     Discharge Instructions     I have attached information regarding gabapentin and medication side effects.  Please review and discuss further with your primary care provider as I suspect this is the source of the symptoms that you are experiencing over the last week.    ED Prescriptions    None     PDMP not reviewed this encounter.   Bing Neighbors, FNP 06/10/20 2252

## 2020-06-11 ENCOUNTER — Other Ambulatory Visit: Payer: Self-pay | Admitting: Internal Medicine

## 2020-06-11 DIAGNOSIS — G500 Trigeminal neuralgia: Secondary | ICD-10-CM

## 2020-06-20 ENCOUNTER — Other Ambulatory Visit: Payer: Managed Care, Other (non HMO)

## 2020-06-23 ENCOUNTER — Other Ambulatory Visit: Payer: Self-pay | Admitting: Orthopedic Surgery

## 2020-06-23 DIAGNOSIS — G8929 Other chronic pain: Secondary | ICD-10-CM

## 2020-06-23 DIAGNOSIS — M545 Low back pain, unspecified: Secondary | ICD-10-CM

## 2020-07-02 ENCOUNTER — Other Ambulatory Visit: Payer: Managed Care, Other (non HMO)

## 2020-07-17 ENCOUNTER — Other Ambulatory Visit: Payer: Managed Care, Other (non HMO)

## 2020-08-11 ENCOUNTER — Other Ambulatory Visit: Payer: Self-pay | Admitting: Chiropractic Medicine

## 2020-08-11 DIAGNOSIS — M5416 Radiculopathy, lumbar region: Secondary | ICD-10-CM

## 2020-08-13 ENCOUNTER — Other Ambulatory Visit: Payer: Managed Care, Other (non HMO)

## 2020-08-14 ENCOUNTER — Ambulatory Visit
Admission: RE | Admit: 2020-08-14 | Discharge: 2020-08-14 | Disposition: A | Discharge status: Home / Self Care | Payer: Managed Care, Other (non HMO) | Source: Ambulatory Visit | Attending: Chiropractic Medicine | Admitting: Chiropractic Medicine

## 2020-08-14 ENCOUNTER — Other Ambulatory Visit: Payer: Self-pay

## 2020-08-14 DIAGNOSIS — M5416 Radiculopathy, lumbar region: Secondary | ICD-10-CM

## 2020-08-14 MED ORDER — IOPAMIDOL (ISOVUE-M 200) INJECTION 41%
1.0000 mL | Freq: Once | INTRAMUSCULAR | Status: AC
  Administered 2020-08-14: 1 mL via EPIDURAL

## 2020-08-14 MED ORDER — METHYLPREDNISOLONE ACETATE 40 MG/ML INJ SUSP (RADIOLOG
120.0000 mg | Freq: Once | INTRAMUSCULAR | Status: AC
  Administered 2020-08-14: 120 mg via EPIDURAL

## 2020-08-14 NOTE — Discharge Instructions (Signed)

## 2020-09-07 ENCOUNTER — Telehealth: Payer: Self-pay | Admitting: Neurology

## 2020-09-07 ENCOUNTER — Ambulatory Visit (INDEPENDENT_AMBULATORY_CARE_PROVIDER_SITE_OTHER): Payer: Managed Care, Other (non HMO) | Admitting: Neurology

## 2020-09-07 ENCOUNTER — Encounter: Payer: Self-pay | Admitting: Neurology

## 2020-09-07 VITALS — BP 128/74 | HR 92 | Ht 71.0 in | Wt 178.6 lb

## 2020-09-07 DIAGNOSIS — R202 Paresthesia of skin: Secondary | ICD-10-CM | POA: Diagnosis not present

## 2020-09-07 DIAGNOSIS — R51 Headache with orthostatic component, not elsewhere classified: Secondary | ICD-10-CM

## 2020-09-07 DIAGNOSIS — R519 Headache, unspecified: Secondary | ICD-10-CM

## 2020-09-07 DIAGNOSIS — R200 Anesthesia of skin: Secondary | ICD-10-CM | POA: Diagnosis not present

## 2020-09-07 NOTE — Progress Notes (Signed)
[facility]    Provider:  Dr Lucia Gaskins Requesting Provider: Georgianne Fick, MD Primary Care Provider:  Georgianne Fick, MD  CC:  Left-sided sensory changes  HPI:  Howard Cook is a 65 y.o. male here as requested by Georgianne Fick, MD for trigeminal neuralgia of the left side.  Past medical history includes diabetes, hypertension, hyperlipidemia, GERD, ED.  I reviewed Dr. York Grice notes: At last appointment CBC, CMP, hemoglobin A1c, TSH and other blood work was ordered, MRI of the brain with and without contrast was also ordered for trigeminal neuralgia of the left side of face, on examination his cranial nerves were intact, plantar reflexes were downgoing bilaterally, all other reflexes were normal, sensation was normal, no facial asymmetry facial weakness, pain included the whole left side of the face most likely related to neuritis from diabetes or migraine, general examination was unremarkable including normal cardiovascular and respiratory exam.  Started several months ago, he started noticing a twitch on his face, he has numbness on the left side of his face and scalp. The whole left side of the head including the face, when he lays on his right side it gets worse, around the eye, the nose, the lips, a twitching or vibrating, but also includes the entire left side of the scalp oto the back of the head, when he lasys on the left it gets better, worse when laying on the opposite side. Not painful but feels like something is wrong, he has some tingling but no burning or pain, started in the face and spread to the head. No vision changes, no speaking problems, he has some sinus issues going on several years, he has been to physicians and visited several ENTs. Stable, not worsening, not painful, it comes and goes, worse when not active, he has had neck pain, he has had a shot in his neck, also back pain in the lower back. Symptoms started around the left eye and then  spread to the whole left side of the head. Started 6 months ago around the left eye then a few weeks after it spread to the lef side of the head. More "heaviness and vibrating: than numb. No hx of migraines or headaches. He has diabetes and back pain. Happens every single day. No other focal neurologic deficits, associated symptoms, inciting events or modifiable factors.  Reviewed notes, labs and imaging from outside physicians, which showed:  Labs drawn June 03, 2020 include CBC with elevated hemoglobin 16.6 otherwise unremarkable, CMP with elevated glucose at 171, BUN 20, creatinine 1.16 otherwise normal, hemoglobin A1c was 8.9, TSH normal   Review of Systems: Patient complains of symptoms per HPI as well as the following symptoms numbness and tingling. Pertinent negatives and positives per HPI. All others negative.   Social History   Socioeconomic History  . Marital status: Divorced    Spouse name: Not on file  . Number of children: 2  . Years of education: college  . Highest education level: Bachelor's degree (e.g., BA, AB, BS)  Occupational History  . Occupation: Retired  Tobacco Use  . Smoking status: Former Smoker    Types: Cigarettes    Quit date: 10/26/1998    Years since quitting: 21.8  . Smokeless tobacco: Never Used  Substance and Sexual Activity  . Alcohol use: Yes    Comment: occasional use  . Drug use: Never  . Sexual activity: Not on file  Other Topics Concern  . Not on file  Social History Narrative  Lives alone.   Right-handed.   1-2 cups caffeine daily.   Social Determinants of Health   Financial Resource Strain: Not on file  Food Insecurity: Not on file  Transportation Needs: Not on file  Physical Activity: Not on file  Stress: Not on file  Social Connections: Not on file  Intimate Partner Violence: Not on file    Family History  Problem Relation Age of Onset  . Other Mother        "died of old age" -- she was in [place]  . Other Father         unsure of health history - died when pt was 89 - he was in [place]  . Stroke Brother     Past Medical History:  Diagnosis Date  . Acid reflux   . Chest pain   . Diabetes mellitus 2003   Pt reports a corrected diagnosis to Type 1 DM (from T2DM) by Dr. Dorisann Frames 3 years ago  . Hyperlipidemia   . Hypertension   . LBP (low back pain)   . Left-sided trigeminal neuralgia   . Sleep apnea     Patient Active Problem List   Diagnosis Date Noted  . 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 Bad oral taste 03/31/2011  . History of sinusitis 03/31/2011    Past Surgical History:  Procedure Laterality Date  . ESOPHAGOGASTRIC FUNDOPLICATION  02/24/11  . HIATAL HERNIA REPAIR  23Aug2012   With NIssen Fundoplication    Current Outpatient Medications  Medication Sig Dispense Refill  . acyclovir (ZOVIRAX) 800 MG tablet Take 800 mg by mouth as needed.    Marland Kitchen aspirin EC 81 MG tablet Take 81 mg by mouth daily. Swallow whole.    . gabapentin (NEURONTIN) 100 MG capsule Take 200 mg by mouth 2 (two) times daily.    . insulin lispro (HUMALOG) 100 UNIT/ML KwikPen Humalog KwikPen (U-100) Insulin 100 unit/mL subcutaneous  INJECT 5 UNITS THREE TIMES A DAY SUBCUTANEOUS 30 DAYS    . JARDIANCE 25 MG TABS tablet Take 25 mg by mouth daily.    Marland Kitchen losartan-hydrochlorothiazide (HYZAAR) 100-25 MG tablet Take 1 tablet by mouth daily.    . Multiple Vitamin (MULTIVITAMIN) capsule Take 1 capsule by mouth daily.    . rosuvastatin (CRESTOR) 40 MG tablet Take 40 mg by mouth daily.    Marland Kitchen VIAGRA 100 MG tablet      No current facility-administered medications for this visit.    Allergies as of 09/07/2020 - Review Complete 09/07/2020  Allergen Reaction Noted  . Oxycodone Hives 12/05/2019    Vitals: BP 128/74   Pulse 92   Ht 5\' 11"  (1.803 m)   Wt 178 lb 9.6 oz (81 kg)   BMI 24.91 kg/m  Last Weight:  Wt Readings from Last 1 Encounters:  09/07/20 178 lb 9.6 oz (81 kg)   Last Height:    Ht Readings from Last 1 Encounters:  09/07/20 5\' 11"  (1.803 m)     Physical exam: Exam: Gen: NAD, conversant, well nourised, well groomed                     CV: RRR, no MRG. No Carotid Bruits. No peripheral edema, warm, nontender Eyes: Conjunctivae clear without exudates or hemorrhage  Neuro: Detailed Neurologic Exam  Speech:    Speech is normal; fluent and spontaneous with normal comprehension.  Cognition:    The patient is oriented to person, place, and time;  recent and remote memory intact;     language fluent;     normal attention, concentration,     fund of knowledge Cranial Nerves:    The pupils are equal, round, and reactive to light. Right fundus flat, could not visualize the left. Visual fields are full to threat. Extraocular movements are intact. Trigeminal sensation is intact and the muscles of mastication are normal. The face is symmetric. The palate elevates in the midline. Hearing intact. Voice is normal. Shoulder shrug is normal. Left tongue protrusion.   Coordination:    No dysmetria or ataxia  Gait:  normal native gait  Motor Observation:    No asymmetry, no atrophy, and no involuntary movements noted. Tone:    Normal muscle tone.    Posture:    Posture is normal. normal erect    Strength:    Strength is V/V in the upper and lower limbs.      Sensation: intact to LT     Reflex Exam:  DTR's:    Deep tendon reflexes in the upper and lower extremities are normal bilaterally.   Toes:    The toes are downgoing bilaterally.   Clonus:    Clonus is absent.    Assessment/Plan:   65 y.o. male here as requested by Georgianne Fick, MD for trigeminal neuralgia of the left side.  Past medical history includes diabetes, hypertension, hyperlipidemia, GERD, ED.   Patient reports left face and head sensory changes, numb and tingly/heaviness, positional in quality, no significant pain, really unusual and unclear etiology, he reports changes in the  occipital nerve distribution and in the trigeminal nerve distribution, not sure how I would localize this, we would definitely need MRI of the brain to evaluate for strokes, multiple cranial nerve enhancement, space-occupying lesion or compressive mass.  Orders Placed This Encounter  Procedures  . MR BRAIN W WO CONTRAST   No orders of the defined types were placed in this encounter.   Cc: Georgianne Fick, MD,  Georgianne Fick, MD  Naomie Dean, MD  [facility] [address] [place], [place] [address]  Phone [phone] Fax [phone]

## 2020-09-07 NOTE — Telephone Encounter (Signed)
Patient returned my call he is scheduled at [facility] for 09/15/20.

## 2020-09-07 NOTE — Telephone Encounter (Signed)
LVM for pt to call back about scheduling Judeen Hammans: [id number] (exp. 09/07/20 to 12/06/20)

## 2020-09-07 NOTE — Patient Instructions (Signed)
MRI brain w/wo contrast Blood work today

## 2020-09-15 ENCOUNTER — Ambulatory Visit: Payer: Managed Care, Other (non HMO)

## 2020-09-15 DIAGNOSIS — R519 Headache, unspecified: Secondary | ICD-10-CM

## 2020-09-15 DIAGNOSIS — R200 Anesthesia of skin: Secondary | ICD-10-CM

## 2020-09-15 DIAGNOSIS — R202 Paresthesia of skin: Secondary | ICD-10-CM | POA: Diagnosis not present

## 2020-09-15 DIAGNOSIS — R51 Headache with orthostatic component, not elsewhere classified: Secondary | ICD-10-CM | POA: Diagnosis not present

## 2020-09-15 MED ORDER — GADOBENATE DIMEGLUMINE 529 MG/ML IV SOLN
15.0000 mL | Freq: Once | INTRAVENOUS | Status: AC | PRN
  Administered 2020-09-15: 15 mL via INTRAVENOUS

## 2020-09-16 ENCOUNTER — Telehealth: Payer: Self-pay | Admitting: *Deleted

## 2020-09-16 NOTE — Telephone Encounter (Signed)
I spoke with the patient and let him know his MRI brain was normal, no strokes or tumors.  He asked if there were any other recommendations as the Gabapentin isn't helping.  The numbness still comes and goes and he notices it more when he is still or at night, rather than when he is working during the day.  I let him know I would check with Dr. Lucia Gaskins.  He had previously been advised to return if symptoms worsen or fail to improve so he is aware he may be returned to primary care at this time but I will call him back.

## 2020-09-16 NOTE — Telephone Encounter (Signed)
-----   Message from Anson Fret, MD sent at 09/15/2020  4:49 PM EDT ----- MRI of the brain is normal. thanks

## 2020-09-16 NOTE — Telephone Encounter (Signed)
Unfortunately nothing really helps numbness. If he were having pain or burning then we could try another medication but he denied any pain. We can send to physical therapy to see if they can help with any exercises that may help.

## 2020-09-16 NOTE — Telephone Encounter (Signed)
Spoke with patient and I discussed Dr. Trevor Mace message.  Patient said he will just see if he improves and if he does not he will call us back.  He verbalized appreciation for the call.

## 2021-02-10 DIAGNOSIS — Z0000 Encounter for general adult medical examination without abnormal findings: Secondary | ICD-10-CM | POA: Diagnosis not present

## 2021-02-10 DIAGNOSIS — Z23 Encounter for immunization: Secondary | ICD-10-CM | POA: Diagnosis not present

## 2021-02-15 DIAGNOSIS — E1165 Type 2 diabetes mellitus with hyperglycemia: Secondary | ICD-10-CM | POA: Diagnosis not present

## 2021-02-16 DIAGNOSIS — E1165 Type 2 diabetes mellitus with hyperglycemia: Secondary | ICD-10-CM | POA: Diagnosis not present

## 2021-02-17 DIAGNOSIS — Z20822 Contact with and (suspected) exposure to covid-19: Secondary | ICD-10-CM | POA: Diagnosis not present

## 2021-03-10 DIAGNOSIS — E782 Mixed hyperlipidemia: Secondary | ICD-10-CM | POA: Diagnosis not present

## 2021-03-10 DIAGNOSIS — E113293 Type 2 diabetes mellitus with mild nonproliferative diabetic retinopathy without macular edema, bilateral: Secondary | ICD-10-CM | POA: Diagnosis not present

## 2021-03-10 DIAGNOSIS — Z0000 Encounter for general adult medical examination without abnormal findings: Secondary | ICD-10-CM | POA: Diagnosis not present

## 2021-03-10 DIAGNOSIS — K219 Gastro-esophageal reflux disease without esophagitis: Secondary | ICD-10-CM | POA: Diagnosis not present

## 2021-03-10 DIAGNOSIS — I10 Essential (primary) hypertension: Secondary | ICD-10-CM | POA: Diagnosis not present

## 2021-03-11 DIAGNOSIS — I10 Essential (primary) hypertension: Secondary | ICD-10-CM | POA: Diagnosis not present

## 2021-03-11 DIAGNOSIS — E1165 Type 2 diabetes mellitus with hyperglycemia: Secondary | ICD-10-CM | POA: Diagnosis not present

## 2021-03-18 DIAGNOSIS — E1165 Type 2 diabetes mellitus with hyperglycemia: Secondary | ICD-10-CM | POA: Diagnosis not present

## 2021-04-17 DIAGNOSIS — E1165 Type 2 diabetes mellitus with hyperglycemia: Secondary | ICD-10-CM | POA: Diagnosis not present

## 2021-05-03 DIAGNOSIS — E1165 Type 2 diabetes mellitus with hyperglycemia: Secondary | ICD-10-CM | POA: Diagnosis not present

## 2021-05-06 DIAGNOSIS — M25511 Pain in right shoulder: Secondary | ICD-10-CM | POA: Diagnosis not present

## 2021-05-20 DIAGNOSIS — M25511 Pain in right shoulder: Secondary | ICD-10-CM | POA: Diagnosis not present

## 2021-05-31 IMAGING — XA Imaging study
2 series · 2 of 2 positions shown · non-contrast
Comparison: none

CLINICAL DATA: Lumbar radiculopathy. Displacement lumbar discs at
L2-3, L3-4, L4-5, and L5-S1. Low back pain extending into left lower
extremity.

[Series 1: ortho standard · 1 of 1 slices shown (1 of 2)]
[im 1/1]
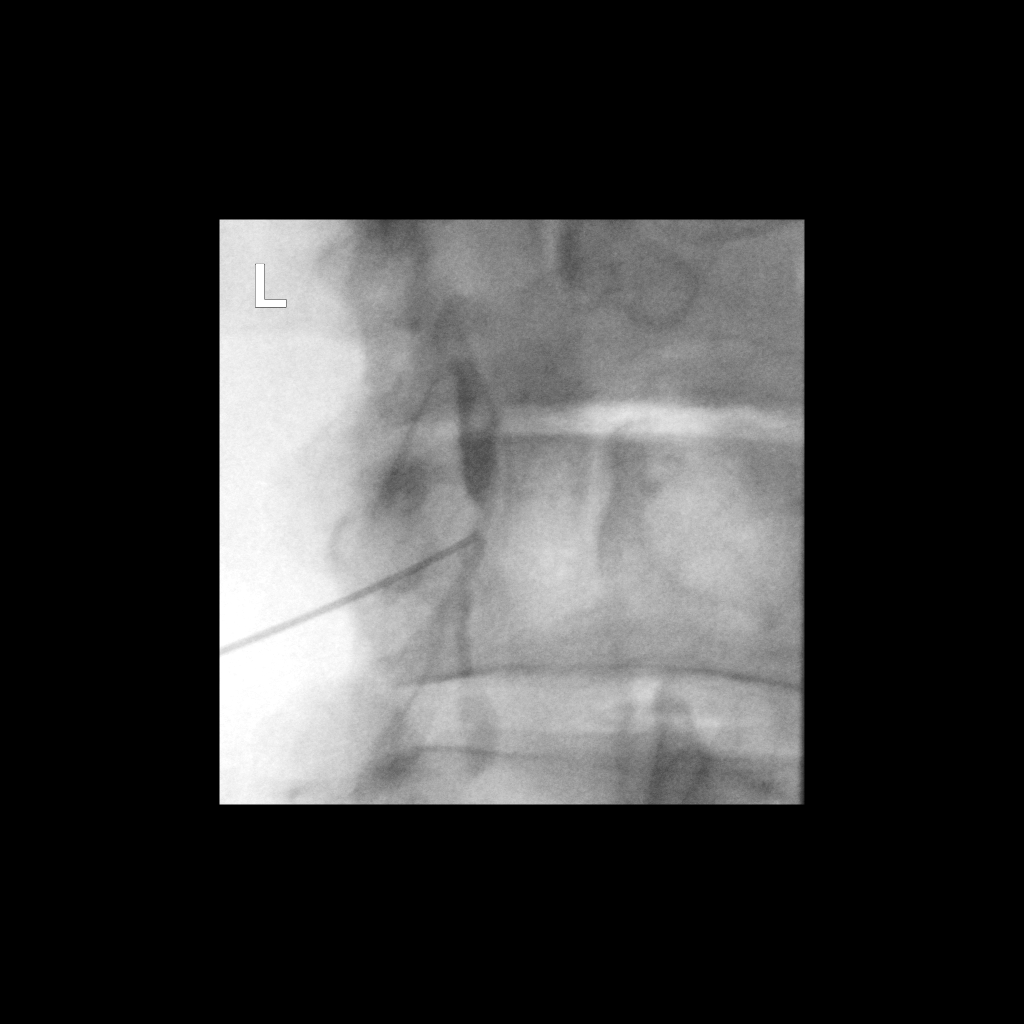

[Series 2: ortho standard · 1 of 1 slices shown (2 of 2)]
[im 1/1]
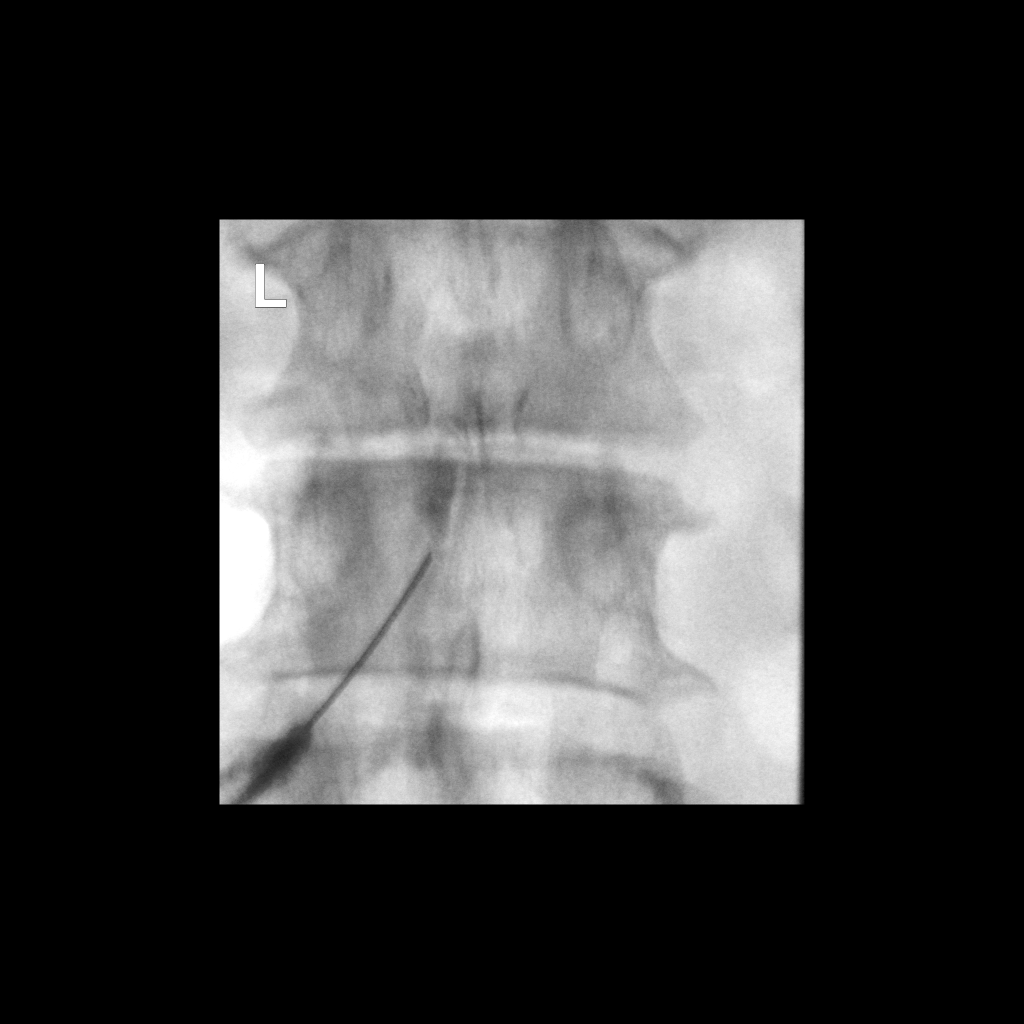

[2 of 2 positions shown; findings below may reference images not displayed]

FLUOROSCOPY TIME:  Radiation Exposure Index (as provided by the
fluoroscopic device): 22.94 uGy*m2

PROCEDURE:
The procedure, risks, benefits, and alternatives were explained to
the patient. Questions regarding the procedure were encouraged and
answered. The patient understands and consents to the procedure.

LUMBAR EPIDURAL INJECTION:

An interlaminar approach was performed on left at L3-4. The
overlying skin was cleansed and anesthetized. A 20 gauge epidural
needle was advanced using loss-of-resistance technique.

DIAGNOSTIC EPIDURAL INJECTION:

Injection of Isovue-M 200 shows a good epidural pattern with spread
above and below the level of needle placement, primarily on the L3-4
no vascular opacification is seen.

THERAPEUTIC EPIDURAL INJECTION:

120 mg of Depo-Medrol mixed with 3 mL 1% lidocaine were instilled.
The procedure was well-tolerated, and the patient was discharged
thirty minutes following the injection in good condition.

COMPLICATIONS:
None
IMPRESSION: Technically successful epidural injection on the left L3-4 # 1

## 2021-06-02 DIAGNOSIS — E1165 Type 2 diabetes mellitus with hyperglycemia: Secondary | ICD-10-CM | POA: Diagnosis not present

## 2021-06-18 DIAGNOSIS — M75121 Complete rotator cuff tear or rupture of right shoulder, not specified as traumatic: Secondary | ICD-10-CM | POA: Diagnosis not present

## 2021-07-02 DIAGNOSIS — H04123 Dry eye syndrome of bilateral lacrimal glands: Secondary | ICD-10-CM | POA: Diagnosis not present

## 2021-07-02 DIAGNOSIS — H25813 Combined forms of age-related cataract, bilateral: Secondary | ICD-10-CM | POA: Diagnosis not present

## 2021-07-02 DIAGNOSIS — E119 Type 2 diabetes mellitus without complications: Secondary | ICD-10-CM | POA: Diagnosis not present

## 2021-07-02 DIAGNOSIS — H524 Presbyopia: Secondary | ICD-10-CM | POA: Diagnosis not present

## 2021-07-27 ENCOUNTER — Telehealth (INDEPENDENT_AMBULATORY_CARE_PROVIDER_SITE_OTHER): Payer: Self-pay

## 2021-08-03 DIAGNOSIS — M79642 Pain in left hand: Secondary | ICD-10-CM | POA: Diagnosis not present

## 2021-08-04 DIAGNOSIS — G894 Chronic pain syndrome: Secondary | ICD-10-CM | POA: Diagnosis not present

## 2021-08-04 DIAGNOSIS — M5136 Other intervertebral disc degeneration, lumbar region: Secondary | ICD-10-CM | POA: Diagnosis not present

## 2021-08-04 DIAGNOSIS — M5416 Radiculopathy, lumbar region: Secondary | ICD-10-CM | POA: Diagnosis not present

## 2021-08-10 DIAGNOSIS — M5416 Radiculopathy, lumbar region: Secondary | ICD-10-CM | POA: Diagnosis not present

## 2021-08-17 DIAGNOSIS — S43431A Superior glenoid labrum lesion of right shoulder, initial encounter: Secondary | ICD-10-CM | POA: Diagnosis not present

## 2021-08-17 DIAGNOSIS — G8918 Other acute postprocedural pain: Secondary | ICD-10-CM | POA: Diagnosis not present

## 2021-08-17 DIAGNOSIS — M19011 Primary osteoarthritis, right shoulder: Secondary | ICD-10-CM | POA: Diagnosis not present

## 2021-08-17 DIAGNOSIS — M75121 Complete rotator cuff tear or rupture of right shoulder, not specified as traumatic: Secondary | ICD-10-CM | POA: Diagnosis not present

## 2021-08-17 DIAGNOSIS — M24111 Other articular cartilage disorders, right shoulder: Secondary | ICD-10-CM | POA: Diagnosis not present

## 2021-08-17 DIAGNOSIS — M7541 Impingement syndrome of right shoulder: Secondary | ICD-10-CM | POA: Diagnosis not present

## 2021-08-24 DIAGNOSIS — M25511 Pain in right shoulder: Secondary | ICD-10-CM | POA: Diagnosis not present

## 2021-08-24 DIAGNOSIS — M25611 Stiffness of right shoulder, not elsewhere classified: Secondary | ICD-10-CM | POA: Diagnosis not present

## 2021-08-30 DIAGNOSIS — Z4789 Encounter for other orthopedic aftercare: Secondary | ICD-10-CM | POA: Diagnosis not present

## 2021-08-31 DIAGNOSIS — M25611 Stiffness of right shoulder, not elsewhere classified: Secondary | ICD-10-CM | POA: Diagnosis not present

## 2021-08-31 DIAGNOSIS — M25511 Pain in right shoulder: Secondary | ICD-10-CM | POA: Diagnosis not present

## 2021-09-07 DIAGNOSIS — M25611 Stiffness of right shoulder, not elsewhere classified: Secondary | ICD-10-CM | POA: Diagnosis not present

## 2021-09-07 DIAGNOSIS — M25511 Pain in right shoulder: Secondary | ICD-10-CM | POA: Diagnosis not present

## 2021-09-08 DIAGNOSIS — E1165 Type 2 diabetes mellitus with hyperglycemia: Secondary | ICD-10-CM | POA: Diagnosis not present

## 2021-09-08 DIAGNOSIS — I10 Essential (primary) hypertension: Secondary | ICD-10-CM | POA: Diagnosis not present

## 2021-09-08 DIAGNOSIS — D751 Secondary polycythemia: Secondary | ICD-10-CM | POA: Diagnosis not present

## 2021-09-08 DIAGNOSIS — E782 Mixed hyperlipidemia: Secondary | ICD-10-CM | POA: Diagnosis not present

## 2021-09-14 DIAGNOSIS — M25611 Stiffness of right shoulder, not elsewhere classified: Secondary | ICD-10-CM | POA: Diagnosis not present

## 2021-09-14 DIAGNOSIS — M25511 Pain in right shoulder: Secondary | ICD-10-CM | POA: Diagnosis not present

## 2021-09-21 DIAGNOSIS — M25511 Pain in right shoulder: Secondary | ICD-10-CM | POA: Diagnosis not present

## 2021-09-21 DIAGNOSIS — M25611 Stiffness of right shoulder, not elsewhere classified: Secondary | ICD-10-CM | POA: Diagnosis not present

## 2021-09-22 DIAGNOSIS — I10 Essential (primary) hypertension: Secondary | ICD-10-CM | POA: Diagnosis not present

## 2021-09-22 DIAGNOSIS — E113293 Type 2 diabetes mellitus with mild nonproliferative diabetic retinopathy without macular edema, bilateral: Secondary | ICD-10-CM | POA: Diagnosis not present

## 2021-09-22 DIAGNOSIS — K219 Gastro-esophageal reflux disease without esophagitis: Secondary | ICD-10-CM | POA: Diagnosis not present

## 2021-09-22 DIAGNOSIS — E782 Mixed hyperlipidemia: Secondary | ICD-10-CM | POA: Diagnosis not present

## 2021-09-28 DIAGNOSIS — M25511 Pain in right shoulder: Secondary | ICD-10-CM | POA: Diagnosis not present

## 2021-09-28 DIAGNOSIS — M25611 Stiffness of right shoulder, not elsewhere classified: Secondary | ICD-10-CM | POA: Diagnosis not present

## 2021-09-29 DIAGNOSIS — E113293 Type 2 diabetes mellitus with mild nonproliferative diabetic retinopathy without macular edema, bilateral: Secondary | ICD-10-CM | POA: Diagnosis not present

## 2021-09-29 DIAGNOSIS — D751 Secondary polycythemia: Secondary | ICD-10-CM | POA: Diagnosis not present

## 2021-09-29 DIAGNOSIS — I10 Essential (primary) hypertension: Secondary | ICD-10-CM | POA: Diagnosis not present

## 2021-09-29 DIAGNOSIS — E7800 Pure hypercholesterolemia, unspecified: Secondary | ICD-10-CM | POA: Diagnosis not present

## 2021-09-29 DIAGNOSIS — R196 Halitosis: Secondary | ICD-10-CM | POA: Diagnosis not present

## 2021-10-01 DIAGNOSIS — M25511 Pain in right shoulder: Secondary | ICD-10-CM | POA: Diagnosis not present

## 2021-10-01 DIAGNOSIS — M25611 Stiffness of right shoulder, not elsewhere classified: Secondary | ICD-10-CM | POA: Diagnosis not present

## 2021-10-05 DIAGNOSIS — M25611 Stiffness of right shoulder, not elsewhere classified: Secondary | ICD-10-CM | POA: Diagnosis not present

## 2021-10-05 DIAGNOSIS — M25511 Pain in right shoulder: Secondary | ICD-10-CM | POA: Diagnosis not present

## 2021-10-07 DIAGNOSIS — M25511 Pain in right shoulder: Secondary | ICD-10-CM | POA: Diagnosis not present

## 2021-10-07 DIAGNOSIS — M25611 Stiffness of right shoulder, not elsewhere classified: Secondary | ICD-10-CM | POA: Diagnosis not present

## 2021-10-15 DIAGNOSIS — M25511 Pain in right shoulder: Secondary | ICD-10-CM | POA: Diagnosis not present

## 2021-10-15 DIAGNOSIS — Z4789 Encounter for other orthopedic aftercare: Secondary | ICD-10-CM | POA: Diagnosis not present

## 2021-10-19 DIAGNOSIS — Z789 Other specified health status: Secondary | ICD-10-CM | POA: Diagnosis not present

## 2021-10-23 DIAGNOSIS — M25511 Pain in right shoulder: Secondary | ICD-10-CM | POA: Diagnosis not present

## 2021-11-03 DIAGNOSIS — M25511 Pain in right shoulder: Secondary | ICD-10-CM | POA: Diagnosis not present

## 2021-11-03 DIAGNOSIS — M25611 Stiffness of right shoulder, not elsewhere classified: Secondary | ICD-10-CM | POA: Diagnosis not present

## 2021-11-08 DIAGNOSIS — M25611 Stiffness of right shoulder, not elsewhere classified: Secondary | ICD-10-CM | POA: Diagnosis not present

## 2021-11-08 DIAGNOSIS — M25511 Pain in right shoulder: Secondary | ICD-10-CM | POA: Diagnosis not present

## 2021-11-11 DIAGNOSIS — M25511 Pain in right shoulder: Secondary | ICD-10-CM | POA: Diagnosis not present

## 2021-11-11 DIAGNOSIS — M25611 Stiffness of right shoulder, not elsewhere classified: Secondary | ICD-10-CM | POA: Diagnosis not present

## 2021-11-16 DIAGNOSIS — R682 Dry mouth, unspecified: Secondary | ICD-10-CM | POA: Diagnosis not present

## 2021-11-19 DIAGNOSIS — M25611 Stiffness of right shoulder, not elsewhere classified: Secondary | ICD-10-CM | POA: Diagnosis not present

## 2021-11-19 DIAGNOSIS — M25511 Pain in right shoulder: Secondary | ICD-10-CM | POA: Diagnosis not present

## 2021-11-23 DIAGNOSIS — M25611 Stiffness of right shoulder, not elsewhere classified: Secondary | ICD-10-CM | POA: Diagnosis not present

## 2021-11-23 DIAGNOSIS — M25511 Pain in right shoulder: Secondary | ICD-10-CM | POA: Diagnosis not present

## 2021-11-26 DIAGNOSIS — M25611 Stiffness of right shoulder, not elsewhere classified: Secondary | ICD-10-CM | POA: Diagnosis not present

## 2021-11-26 DIAGNOSIS — M25511 Pain in right shoulder: Secondary | ICD-10-CM | POA: Diagnosis not present

## 2021-11-30 DIAGNOSIS — M25611 Stiffness of right shoulder, not elsewhere classified: Secondary | ICD-10-CM | POA: Diagnosis not present

## 2021-11-30 DIAGNOSIS — M25511 Pain in right shoulder: Secondary | ICD-10-CM | POA: Diagnosis not present

## 2021-12-02 DIAGNOSIS — M25611 Stiffness of right shoulder, not elsewhere classified: Secondary | ICD-10-CM | POA: Diagnosis not present

## 2021-12-02 DIAGNOSIS — M25511 Pain in right shoulder: Secondary | ICD-10-CM | POA: Diagnosis not present

## 2021-12-08 DIAGNOSIS — Z4789 Encounter for other orthopedic aftercare: Secondary | ICD-10-CM | POA: Diagnosis not present

## 2022-01-06 DIAGNOSIS — R072 Precordial pain: Secondary | ICD-10-CM | POA: Diagnosis not present

## 2022-02-07 DIAGNOSIS — M25511 Pain in right shoulder: Secondary | ICD-10-CM | POA: Diagnosis not present

## 2022-02-07 DIAGNOSIS — Z4789 Encounter for other orthopedic aftercare: Secondary | ICD-10-CM | POA: Diagnosis not present

## 2022-02-11 DIAGNOSIS — G4733 Obstructive sleep apnea (adult) (pediatric): Secondary | ICD-10-CM | POA: Diagnosis not present

## 2022-02-11 DIAGNOSIS — Z794 Long term (current) use of insulin: Secondary | ICD-10-CM | POA: Diagnosis not present

## 2022-02-11 DIAGNOSIS — H259 Unspecified age-related cataract: Secondary | ICD-10-CM | POA: Diagnosis not present

## 2022-02-11 DIAGNOSIS — E1169 Type 2 diabetes mellitus with other specified complication: Secondary | ICD-10-CM | POA: Diagnosis not present

## 2022-02-11 DIAGNOSIS — E663 Overweight: Secondary | ICD-10-CM | POA: Diagnosis not present

## 2022-02-11 DIAGNOSIS — E1122 Type 2 diabetes mellitus with diabetic chronic kidney disease: Secondary | ICD-10-CM | POA: Diagnosis not present

## 2022-02-11 DIAGNOSIS — G8929 Other chronic pain: Secondary | ICD-10-CM | POA: Diagnosis not present

## 2022-02-11 DIAGNOSIS — E1165 Type 2 diabetes mellitus with hyperglycemia: Secondary | ICD-10-CM | POA: Diagnosis not present

## 2022-02-11 DIAGNOSIS — E782 Mixed hyperlipidemia: Secondary | ICD-10-CM | POA: Diagnosis not present

## 2022-02-11 DIAGNOSIS — E785 Hyperlipidemia, unspecified: Secondary | ICD-10-CM | POA: Diagnosis not present

## 2022-02-11 DIAGNOSIS — K219 Gastro-esophageal reflux disease without esophagitis: Secondary | ICD-10-CM | POA: Diagnosis not present

## 2022-02-11 DIAGNOSIS — I10 Essential (primary) hypertension: Secondary | ICD-10-CM | POA: Diagnosis not present

## 2022-03-03 DIAGNOSIS — E1165 Type 2 diabetes mellitus with hyperglycemia: Secondary | ICD-10-CM | POA: Diagnosis not present

## 2022-03-03 DIAGNOSIS — E782 Mixed hyperlipidemia: Secondary | ICD-10-CM | POA: Diagnosis not present

## 2022-03-03 DIAGNOSIS — Z0000 Encounter for general adult medical examination without abnormal findings: Secondary | ICD-10-CM | POA: Diagnosis not present

## 2022-03-03 DIAGNOSIS — D751 Secondary polycythemia: Secondary | ICD-10-CM | POA: Diagnosis not present

## 2022-03-10 DIAGNOSIS — Z0000 Encounter for general adult medical examination without abnormal findings: Secondary | ICD-10-CM | POA: Diagnosis not present

## 2022-03-10 DIAGNOSIS — H9313 Tinnitus, bilateral: Secondary | ICD-10-CM | POA: Diagnosis not present

## 2022-03-10 DIAGNOSIS — D751 Secondary polycythemia: Secondary | ICD-10-CM | POA: Diagnosis not present

## 2022-03-10 DIAGNOSIS — E782 Mixed hyperlipidemia: Secondary | ICD-10-CM | POA: Diagnosis not present

## 2022-03-10 DIAGNOSIS — K219 Gastro-esophageal reflux disease without esophagitis: Secondary | ICD-10-CM | POA: Diagnosis not present

## 2022-03-10 DIAGNOSIS — E113293 Type 2 diabetes mellitus with mild nonproliferative diabetic retinopathy without macular edema, bilateral: Secondary | ICD-10-CM | POA: Diagnosis not present

## 2022-03-10 DIAGNOSIS — E1165 Type 2 diabetes mellitus with hyperglycemia: Secondary | ICD-10-CM | POA: Diagnosis not present

## 2022-03-10 DIAGNOSIS — R196 Halitosis: Secondary | ICD-10-CM | POA: Diagnosis not present

## 2022-03-10 DIAGNOSIS — J301 Allergic rhinitis due to pollen: Secondary | ICD-10-CM | POA: Diagnosis not present

## 2022-03-10 DIAGNOSIS — I10 Essential (primary) hypertension: Secondary | ICD-10-CM | POA: Diagnosis not present

## 2022-03-10 DIAGNOSIS — E7800 Pure hypercholesterolemia, unspecified: Secondary | ICD-10-CM | POA: Diagnosis not present

## 2022-03-11 DIAGNOSIS — Z4789 Encounter for other orthopedic aftercare: Secondary | ICD-10-CM | POA: Diagnosis not present

## 2022-03-11 DIAGNOSIS — Z0000 Encounter for general adult medical examination without abnormal findings: Secondary | ICD-10-CM | POA: Diagnosis not present

## 2022-03-16 ENCOUNTER — Telehealth: Payer: Self-pay | Admitting: Hematology and Oncology

## 2022-03-16 NOTE — Telephone Encounter (Signed)
Scheduled appt per 9/12 referral. Pt is aware of appt date and time. Pt is aware to arrive 15 mins prior to appt time and to bring and updated insurance card. Pt is aware of appt location.   

## 2022-03-23 DIAGNOSIS — Z0000 Encounter for general adult medical examination without abnormal findings: Secondary | ICD-10-CM | POA: Diagnosis not present

## 2022-03-23 DIAGNOSIS — E113293 Type 2 diabetes mellitus with mild nonproliferative diabetic retinopathy without macular edema, bilateral: Secondary | ICD-10-CM | POA: Diagnosis not present

## 2022-03-23 DIAGNOSIS — I10 Essential (primary) hypertension: Secondary | ICD-10-CM | POA: Diagnosis not present

## 2022-03-23 DIAGNOSIS — E782 Mixed hyperlipidemia: Secondary | ICD-10-CM | POA: Diagnosis not present

## 2022-03-23 DIAGNOSIS — D751 Secondary polycythemia: Secondary | ICD-10-CM | POA: Diagnosis not present

## 2022-03-30 DIAGNOSIS — R0981 Nasal congestion: Secondary | ICD-10-CM | POA: Diagnosis not present

## 2022-03-30 DIAGNOSIS — R067 Sneezing: Secondary | ICD-10-CM | POA: Diagnosis not present

## 2022-03-30 DIAGNOSIS — R051 Acute cough: Secondary | ICD-10-CM | POA: Diagnosis not present

## 2022-04-05 ENCOUNTER — Other Ambulatory Visit: Payer: Self-pay

## 2022-04-05 ENCOUNTER — Inpatient Hospital Stay: Payer: HMO | Attending: Hematology and Oncology | Admitting: Hematology and Oncology

## 2022-04-05 ENCOUNTER — Encounter: Payer: Self-pay | Admitting: Hematology and Oncology

## 2022-04-05 VITALS — BP 137/73 | HR 75 | Temp 97.4°F | Resp 18 | Ht 71.0 in | Wt 184.2 lb

## 2022-04-05 DIAGNOSIS — D751 Secondary polycythemia: Secondary | ICD-10-CM | POA: Diagnosis not present

## 2022-04-05 DIAGNOSIS — Z79899 Other long term (current) drug therapy: Secondary | ICD-10-CM | POA: Diagnosis not present

## 2022-04-05 DIAGNOSIS — Z87891 Personal history of nicotine dependence: Secondary | ICD-10-CM

## 2022-04-05 DIAGNOSIS — D696 Thrombocytopenia, unspecified: Secondary | ICD-10-CM

## 2022-04-05 DIAGNOSIS — G4733 Obstructive sleep apnea (adult) (pediatric): Secondary | ICD-10-CM | POA: Diagnosis not present

## 2022-04-05 NOTE — Assessment & Plan Note (Addendum)
He has been on CPAP machine for over 20 years I 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

## 2022-04-05 NOTE — Progress Notes (Signed)
[place] NOTE  Patient Care Team: Merrilee Seashore, MD as PCP - General (Internal Medicine) Carol Ada, MD as Consulting Physician (Gastroenterology) Rozetta Nunnery, MD (Inactive) as Consulting Physician (Otolaryngology) Izora Gala, MD as Consulting Physician (Otolaryngology)  CHIEF COMPLAINTS/PURPOSE OF CONSULTATION:  Erythrocytosis and mild thrombocytopenia  HISTORY OF PRESENTING ILLNESS:  Howard Cook 66 y.o. male is here because of elevated hemoglobin and intermittent thrombocytopenia  He was found to have abnormal CBC from some blood work I have the opportunity to review his CBC dated all the way back to 2011 On 09/03/2009, white blood cell count 4.5, hemoglobin 15.5 and platelet count 144 On 02/17/2011, white blood cell count 5.7, hemoglobin 15.1 and platelet count 150 On 02/10/2021, white blood cell count is 4.0, hemoglobin 16.8, hematocrit of 50.2 and platelet count 150 On 09/23/2021, white blood cell count 3.5, hemoglobin 16.4, hematocrit 50.4 and platelet count 116 On 03/03/2022, white blood cell count 4.4, hemoglobin 16.7, hematocrit 53.5 and platelet count 153 He denies intermittent headaches, shortness of breath on exertion, frequent leg cramps and occasional chest pain.  He never suffer from diagnosis of blood clot.  He is diagnosed with obstructive sleep apnea. The patient denies weight loss or skin itching. He does not smoke.  He complain of halitosis on the chronic basis  MEDICAL HISTORY:  Past Medical History:  Diagnosis Date   Acid reflux    Chest pain    Diabetes mellitus 2003   Pt reports a corrected diagnosis to Type 1 DM (from T2DM) by Dr. Jacelyn Pi 3 years ago   Hyperlipidemia    Hypertension    LBP (low back pain)    Left-sided trigeminal neuralgia    Sleep apnea     SURGICAL HISTORY: Past Surgical History:  Procedure Laterality Date   ESOPHAGOGASTRIC FUNDOPLICATION  [id number]   HIATAL HERNIA REPAIR   23Aug2012   With NIssen Fundoplication   TONSILLECTOMY AND ADENOIDECTOMY      SOCIAL HISTORY: Social History   Socioeconomic History   Marital status: Divorced    Spouse name: Not on file   Number of children: 2   Years of education: college   Highest education level: Bachelor's degree (e.g., BA, AB, BS)  Occupational History   Occupation: Retired  Tobacco Use   Smoking status: Former    Types: Cigarettes    Quit date: 10/26/1998    Years since quitting: 23.4   Smokeless tobacco: Never  Substance and Sexual Activity   Alcohol use: Yes    Comment: occasional use   Drug use: Never   Sexual activity: Not on file  Other Topics Concern   Not on file  Social History Narrative   Lives alone.   Right-handed.   1-2 cups caffeine daily.   Social Determinants of Health   Financial Resource Strain: Not on file  Food Insecurity: Not on file  Transportation Needs: Not on file  Physical Activity: Not on file  Stress: Not on file  Social Connections: Not on file  Intimate Partner Violence: Not on file    FAMILY HISTORY: Family History  Problem Relation Age of Onset   Other Mother        "died of old age" -- she was in [place]   Other Father        unsure of health history - died when pt was 89 - he was in [place]   Stroke Brother     ALLERGIES:  is allergic to oxycodone.  MEDICATIONS:  Current Outpatient Medications  Medication Sig Dispense Refill   glimepiride (AMARYL) 1 MG tablet Take 1 mg by mouth daily with breakfast.     insulin aspart (NOVOLOG) 100 UNIT/ML injection Inject 8 Units into the skin 3 (three) times daily before meals. 3 to 8 units once daily     Insulin Degludec (TRESIBA [place]) Inject 8 Units into the skin daily.     acyclovir (ZOVIRAX) 800 MG tablet Take 800 mg by mouth as needed.     aspirin EC 81 MG tablet Take 81 mg by mouth daily. Swallow whole.     losartan-hydrochlorothiazide (HYZAAR) 100-25 MG tablet Take 1 tablet by mouth daily.     Multiple  Vitamin (MULTIVITAMIN) capsule Take 1 capsule by mouth daily.     rosuvastatin (CRESTOR) 40 MG tablet Take 40 mg by mouth daily.     VIAGRA 100 MG tablet      No current facility-administered medications for this visit.    REVIEW OF SYSTEMS:   Constitutional: Denies fevers, chills or abnormal night sweats Eyes: Denies blurriness of vision, double vision or watery eyes Ears, nose, mouth, throat, and face: Denies mucositis or sore throat Respiratory: Denies cough, dyspnea or wheezes Cardiovascular: Denies palpitation, chest discomfort or lower extremity swelling Gastrointestinal:  Denies nausea, heartburn or change in bowel habits Skin: Denies abnormal skin rashes Lymphatics: Denies new lymphadenopathy or easy bruising Neurological:Denies numbness, tingling or new weaknesses Behavioral/Psych: Mood is stable, no new changes  All other systems were reviewed with the patient and are negative.  PHYSICAL EXAMINATION: ECOG PERFORMANCE STATUS: 1 - Symptomatic but completely ambulatory  Vitals:   04/05/22 1302  BP: 137/73  Pulse: 75  Resp: 18  Temp: (!) 97.4 F (36.3 C)  SpO2: 100%   Filed Weights   04/05/22 1302  Weight: 184 lb 3.2 oz (83.6 kg)    GENERAL:alert, no distress and comfortable SKIN: skin color, texture, turgor are normal, no rashes or significant lesions EYES: normal, conjunctiva are pink and non-injected, sclera clear OROPHARYNX:no exudate, no erythema and lips, buccal mucosa, and tongue normal  NECK: supple, thyroid normal size, non-tender, without nodularity LYMPH:  no palpable lymphadenopathy in the cervical, axillary or inguinal LUNGS: clear to auscultation and percussion with normal breathing effort HEART: regular rate & rhythm and no murmurs and no lower extremity edema ABDOMEN:abdomen soft, non-tender and normal bowel sounds Musculoskeletal:no cyanosis of digits and no clubbing  PSYCH: alert & oriented x 3 with fluent speech NEURO: no focal motor/sensory  deficits  LABORATORY DATA:  I have reviewed the data as listed  ASSESSMENT & PLAN Erythrocytosis The cause of erythrocytosis is multifactorial I suspect there is an element of dehydration and due to undertreated obstructive sleep apnea I also wonder if he has thalassemia causing elevated RBC count He does not need treatment for this I recommend increase fluid hydration He is on aspirin I plan to repeat this again in 3 months for further follow-up along with other additional work-up  Obstructive apnea He has been on CPAP machine for over 20 years I 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   Orders Placed This Encounter  Procedures   CBC with Differential ([facility] Only)    Standing Status:   Future    Standing Expiration Date:   04/06/2023   CMP ([facility] only)    Standing Status:   Future    Standing Expiration Date:   04/06/2023   Erythropoietin    Standing Status:  Future    Standing Expiration Date:   04/06/2023   Lactate dehydrogenase    Standing Status:   Future    Standing Expiration Date:   04/06/2023   Hgb Fractionation [place]    Standing Status:   Future    Standing Expiration Date:   04/06/2023    The total time spent in the appointment was 60 minutes encounter with patients including review of chart and various tests results, discussions about plan of care and coordination of care plan   All questions were answered. The patient knows to call the clinic with any problems, questions or concerns. No barriers to learning was detected.  Heath Lark, MD 10/3/20234:18 PM

## 2022-04-05 NOTE — Assessment & Plan Note (Signed)
The cause of erythrocytosis is multifactorial I suspect there is an element of dehydration and due to undertreated obstructive sleep apnea I also wonder if he has thalassemia causing elevated RBC count He does not need treatment for this I recommend increase fluid hydration He is on aspirin I plan to repeat this again in 3 months for further follow-up along with other additional work-up

## 2022-07-01 ENCOUNTER — Telehealth: Payer: Self-pay | Admitting: Hematology and Oncology

## 2022-07-01 NOTE — Telephone Encounter (Signed)
Patient called to change time for labs on 1/2. Patient notified of earlier appointment time.

## 2022-07-05 ENCOUNTER — Other Ambulatory Visit: Payer: Self-pay

## 2022-07-05 ENCOUNTER — Inpatient Hospital Stay: Payer: PPO | Attending: Hematology and Oncology

## 2022-07-05 ENCOUNTER — Other Ambulatory Visit: Payer: Medicare Other

## 2022-07-05 DIAGNOSIS — D751 Secondary polycythemia: Secondary | ICD-10-CM | POA: Insufficient documentation

## 2022-07-05 DIAGNOSIS — H04123 Dry eye syndrome of bilateral lacrimal glands: Secondary | ICD-10-CM | POA: Diagnosis not present

## 2022-07-05 DIAGNOSIS — H25813 Combined forms of age-related cataract, bilateral: Secondary | ICD-10-CM | POA: Diagnosis not present

## 2022-07-05 DIAGNOSIS — G4733 Obstructive sleep apnea (adult) (pediatric): Secondary | ICD-10-CM | POA: Insufficient documentation

## 2022-07-05 DIAGNOSIS — E119 Type 2 diabetes mellitus without complications: Secondary | ICD-10-CM | POA: Diagnosis not present

## 2022-07-05 DIAGNOSIS — H524 Presbyopia: Secondary | ICD-10-CM | POA: Diagnosis not present

## 2022-07-05 LAB — CMP (CANCER CENTER ONLY)
ALT: 33 U/L (ref 0–44)
AST: 24 U/L (ref 15–41)
Albumin: 4.2 g/dL (ref 3.5–5.0)
Alkaline Phosphatase: 50 U/L (ref 38–126)
Anion gap: 7 (ref 5–15)
BUN: 16 mg/dL (ref 8–23)
CO2: 27 mmol/L (ref 22–32)
Calcium: 10 mg/dL (ref 8.9–10.3)
Chloride: 103 mmol/L (ref 98–111)
Creatinine: 0.93 mg/dL (ref 0.61–1.24)
GFR, Estimated: >60 mL/min (ref >60)
Glucose, Bld: 171 mg/dL — ABNORMAL HIGH (ref 70–99)
Potassium: 3.6 mmol/L (ref 3.5–5.1)
Sodium: 137 mmol/L (ref 135–145)
Total Bilirubin: 0.6 mg/dL (ref 0.3–1.2)
Total Protein: 6.9 g/dL (ref 6.5–8.1)

## 2022-07-05 LAB — CBC WITH DIFFERENTIAL (CANCER CENTER ONLY)
Abs Immature Granulocytes: 0.01 10*3/uL (ref 0.00–0.07)
Basophils Absolute: 0 10*3/uL (ref 0.0–0.1)
Basophils Relative: 0 %
Eosinophils Absolute: 0 10*3/uL (ref 0.0–0.5)
Eosinophils Relative: 0 %
HCT: 49.9 % (ref 39.0–52.0)
Hemoglobin: 16.1 g/dL (ref 13.0–17.0)
Immature Granulocytes: 0 %
Lymphocytes Relative: 25 %
Lymphs Abs: 1.3 10*3/uL (ref 0.7–4.0)
MCH: 26.3 pg (ref 26.0–34.0)
MCHC: 32.3 g/dL (ref 30.0–36.0)
MCV: 81.5 fL (ref 80.0–100.0)
Monocytes Absolute: 0.5 10*3/uL (ref 0.1–1.0)
Monocytes Relative: 10 %
Neutro Abs: 3.3 10*3/uL (ref 1.7–7.7)
Neutrophils Relative %: 65 %
Platelet Count: 156 10*3/uL (ref 150–400)
RBC: 6.12 MIL/uL — ABNORMAL HIGH (ref 4.22–5.81)
RDW: 13.9 % (ref 11.5–15.5)
WBC Count: 5.2 10*3/uL (ref 4.0–10.5)
nRBC: 0 % (ref 0.0–0.2)

## 2022-07-05 LAB — LACTATE DEHYDROGENASE: LDH: 174 U/L (ref 98–192)

## 2022-07-06 LAB — ERYTHROPOIETIN: Erythropoietin: 6.9 m[IU]/mL (ref 2.6–18.5)

## 2022-07-07 LAB — HGB FRACTIONATION CASCADE
Hgb A2: 2.7 % (ref 1.8–3.2)
Hgb A: 97.3 % (ref 96.4–98.8)
Hgb F: 0 % (ref 0.0–2.0)
Hgb S: 0 %

## 2022-07-08 ENCOUNTER — Other Ambulatory Visit: Payer: Self-pay

## 2022-07-08 ENCOUNTER — Encounter: Payer: Self-pay | Admitting: Hematology and Oncology

## 2022-07-08 ENCOUNTER — Inpatient Hospital Stay (HOSPITAL_BASED_OUTPATIENT_CLINIC_OR_DEPARTMENT_OTHER): Payer: PPO | Admitting: Hematology and Oncology

## 2022-07-08 VITALS — BP 133/76 | HR 77 | Temp 98.0°F | Resp 18 | Ht 71.0 in | Wt 179.6 lb

## 2022-07-08 DIAGNOSIS — D751 Secondary polycythemia: Secondary | ICD-10-CM | POA: Diagnosis not present

## 2022-07-08 DIAGNOSIS — G4733 Obstructive sleep apnea (adult) (pediatric): Secondary | ICD-10-CM

## 2022-07-08 NOTE — Assessment & Plan Note (Addendum)
The patient has lost 5 pounds since last time I saw him His blood sugar has improved His hemoglobin is stable We discussed importance of lifestyle changes and weight loss as treatment for obstructive sleep apnea He does not need phlebotomy

## 2022-07-08 NOTE — Assessment & Plan Note (Signed)
The cause of erythrocytosis is multifactorial I suspect there is an element of dehydration and due to undertreated obstructive sleep apnea I also wonder if Howard Cook has thalassemia causing elevated RBC count; unfortunately, the recent hemoglobin fraction test did not include alpha thalassemia evaluation Overall, I suspect Howard Cook might have thalassemia Howard Cook does not need treatment for this I recommend increase fluid hydration prior to future blood work

## 2022-07-08 NOTE — Progress Notes (Signed)
[place] OFFICE PROGRESS NOTE  Merrilee Seashore, MD  ASSESSMENT & PLAN:  Erythrocytosis The cause of erythrocytosis is multifactorial I suspect there is an element of dehydration and due to undertreated obstructive sleep apnea I also wonder if he has thalassemia causing elevated RBC count; unfortunately, the recent hemoglobin fraction test did not include alpha thalassemia evaluation Overall, I suspect he might have thalassemia He does not need treatment for this I recommend increase fluid hydration prior to future blood work   Obstructive apnea The patient has lost 5 pounds since last time I saw him His blood sugar has improved His hemoglobin is stable We discussed importance of lifestyle changes and weight loss as treatment for obstructive sleep apnea He does not need phlebotomy  No orders of the defined types were placed in this encounter.   The total time spent in the appointment was 20 minutes encounter with patients including review of chart and various tests results, discussions about plan of care and coordination of care plan   All questions were answered. The patient knows to call the clinic with any problems, questions or concerns. No barriers to learning was detected.    Heath Lark, MD 1/5/20241:02 PM  INTERVAL HISTORY: Howard Cook 67 y.o. male returns for review test results Since last time I saw him, he has lost 5 pounds He felt great  SUMMARY OF HEMATOLOGIC HISTORY:  He was found to have abnormal CBC from some blood work I have the opportunity to review his CBC dated all the way back to 2011 On 09/03/2009, white blood cell count 4.5, hemoglobin 15.5 and platelet count 144 On 02/17/2011, white blood cell count 5.7, hemoglobin 15.1 and platelet count 150 On 02/10/2021, white blood cell count is 4.0, hemoglobin 16.8, hematocrit of 50.2 and platelet count 150 On 09/23/2021, white blood cell count 3.5, hemoglobin 16.4, hematocrit 50.4 and  platelet count 116 On 03/03/2022, white blood cell count 4.4, hemoglobin 16.7, hematocrit 53.5 and platelet count 153 He denies intermittent headaches, shortness of breath on exertion, frequent leg cramps and occasional chest pain.  He never suffer from diagnosis of blood clot.  He is diagnosed with obstructive sleep apnea. The patient denies weight loss or skin itching. He does not smoke.  He complain of halitosis on the chronic basis  I have reviewed the past medical history, past surgical history, social history and family history with the patient and they are unchanged from previous note.  ALLERGIES:  is allergic to oxycodone.  MEDICATIONS:  Current Outpatient Medications  Medication Sig Dispense Refill   acyclovir (ZOVIRAX) 800 MG tablet Take 800 mg by mouth as needed.     aspirin EC 81 MG tablet Take 81 mg by mouth daily. Swallow whole.     glimepiride (AMARYL) 1 MG tablet Take 1 mg by mouth daily with breakfast.     insulin aspart (NOVOLOG) 100 UNIT/ML injection Inject 8 Units into the skin 3 (three) times daily before meals. 3 to 8 units once daily     Insulin Degludec (TRESIBA [place]) Inject 8 Units into the skin daily.     losartan-hydrochlorothiazide (HYZAAR) 100-25 MG tablet Take 1 tablet by mouth daily.     Multiple Vitamin (MULTIVITAMIN) capsule Take 1 capsule by mouth daily.     rosuvastatin (CRESTOR) 40 MG tablet Take 40 mg by mouth daily.     VIAGRA 100 MG tablet      No current facility-administered medications for this visit.     REVIEW  OF SYSTEMS:   Constitutional: Denies fevers, chills or night sweats Eyes: Denies blurriness of vision Ears, nose, mouth, throat, and face: Denies mucositis or sore throat Respiratory: Denies cough, dyspnea or wheezes Cardiovascular: Denies palpitation, chest discomfort or lower extremity swelling Gastrointestinal:  Denies nausea, heartburn or change in bowel habits Skin: Denies abnormal skin rashes Lymphatics: Denies new  lymphadenopathy or easy bruising Neurological:Denies numbness, tingling or new weaknesses Behavioral/Psych: Mood is stable, no new changes  All other systems were reviewed with the patient and are negative.  PHYSICAL EXAMINATION: ECOG PERFORMANCE STATUS: 0 - Asymptomatic  Vitals:   07/08/22 1242  BP: 133/76  Pulse: 77  Resp: 18  Temp: 98 F (36.7 C)  SpO2: 100%   Filed Weights   07/08/22 1242  Weight: 179 lb 9.6 oz (81.5 kg)    GENERAL:alert, no distress and comfortable NEURO: alert & oriented x 3 with fluent speech, no focal motor/sensory deficits  LABORATORY DATA:  I have reviewed the data as listed     Component Value Date/Time   NA 137 07/05/2022 1014   K 3.6 07/05/2022 1014   CL 103 07/05/2022 1014   CO2 27 07/05/2022 1014   GLUCOSE 171 (H) 07/05/2022 1014   BUN 16 07/05/2022 1014   CREATININE 0.93 07/05/2022 1014   CALCIUM 10.0 07/05/2022 1014   PROT 6.9 07/05/2022 1014   ALBUMIN 4.2 07/05/2022 1014   AST 24 07/05/2022 1014   ALT 33 07/05/2022 1014   ALKPHOS 50 07/05/2022 1014   BILITOT 0.6 07/05/2022 1014   GFRNONAA >60 07/05/2022 1014   GFRAA >60 02/17/2011 0910    No results found for: "SPEP", "UPEP"  Lab Results  Component Value Date   WBC 5.2 07/05/2022   NEUTROABS 3.3 07/05/2022   HGB 16.1 07/05/2022   HCT 49.9 07/05/2022   MCV 81.5 07/05/2022   PLT 156 07/05/2022      Chemistry      Component Value Date/Time   NA 137 07/05/2022 1014   K 3.6 07/05/2022 1014   CL 103 07/05/2022 1014   CO2 27 07/05/2022 1014   BUN 16 07/05/2022 1014   CREATININE 0.93 07/05/2022 1014      Component Value Date/Time   CALCIUM 10.0 07/05/2022 1014   ALKPHOS 50 07/05/2022 1014   AST 24 07/05/2022 1014   ALT 33 07/05/2022 1014   BILITOT 0.6 07/05/2022 1014

## 2022-08-04 DIAGNOSIS — I10 Essential (primary) hypertension: Secondary | ICD-10-CM | POA: Diagnosis not present

## 2022-08-04 DIAGNOSIS — E782 Mixed hyperlipidemia: Secondary | ICD-10-CM | POA: Diagnosis not present

## 2022-08-04 DIAGNOSIS — D751 Secondary polycythemia: Secondary | ICD-10-CM | POA: Diagnosis not present

## 2022-08-11 DIAGNOSIS — R682 Dry mouth, unspecified: Secondary | ICD-10-CM | POA: Diagnosis not present

## 2022-08-11 DIAGNOSIS — D751 Secondary polycythemia: Secondary | ICD-10-CM | POA: Diagnosis not present

## 2022-08-11 DIAGNOSIS — I10 Essential (primary) hypertension: Secondary | ICD-10-CM | POA: Diagnosis not present

## 2022-08-11 DIAGNOSIS — Z125 Encounter for screening for malignant neoplasm of prostate: Secondary | ICD-10-CM | POA: Diagnosis not present

## 2022-08-11 DIAGNOSIS — E113293 Type 2 diabetes mellitus with mild nonproliferative diabetic retinopathy without macular edema, bilateral: Secondary | ICD-10-CM | POA: Diagnosis not present

## 2022-08-11 DIAGNOSIS — E782 Mixed hyperlipidemia: Secondary | ICD-10-CM | POA: Diagnosis not present

## 2022-08-13 DIAGNOSIS — Z6824 Body mass index (BMI) 24.0-24.9, adult: Secondary | ICD-10-CM | POA: Diagnosis not present

## 2022-08-13 DIAGNOSIS — I10 Essential (primary) hypertension: Secondary | ICD-10-CM | POA: Diagnosis not present

## 2022-08-13 DIAGNOSIS — Z20822 Contact with and (suspected) exposure to covid-19: Secondary | ICD-10-CM | POA: Diagnosis not present

## 2022-08-13 DIAGNOSIS — Z03818 Encounter for observation for suspected exposure to other biological agents ruled out: Secondary | ICD-10-CM | POA: Diagnosis not present

## 2022-08-13 DIAGNOSIS — Z23 Encounter for immunization: Secondary | ICD-10-CM | POA: Diagnosis not present

## 2022-08-13 DIAGNOSIS — J00 Acute nasopharyngitis [common cold]: Secondary | ICD-10-CM | POA: Diagnosis not present

## 2022-08-16 DIAGNOSIS — K219 Gastro-esophageal reflux disease without esophagitis: Secondary | ICD-10-CM | POA: Diagnosis not present

## 2022-08-16 DIAGNOSIS — E1165 Type 2 diabetes mellitus with hyperglycemia: Secondary | ICD-10-CM | POA: Diagnosis not present

## 2022-08-16 DIAGNOSIS — R682 Dry mouth, unspecified: Secondary | ICD-10-CM | POA: Diagnosis not present

## 2022-08-16 DIAGNOSIS — R196 Halitosis: Secondary | ICD-10-CM | POA: Diagnosis not present

## 2022-08-16 DIAGNOSIS — H04129 Dry eye syndrome of unspecified lacrimal gland: Secondary | ICD-10-CM | POA: Diagnosis not present

## 2022-09-07 DIAGNOSIS — E1165 Type 2 diabetes mellitus with hyperglycemia: Secondary | ICD-10-CM | POA: Diagnosis not present

## 2022-09-07 DIAGNOSIS — E7800 Pure hypercholesterolemia, unspecified: Secondary | ICD-10-CM | POA: Diagnosis not present

## 2022-09-07 DIAGNOSIS — Z7189 Other specified counseling: Secondary | ICD-10-CM | POA: Diagnosis not present

## 2022-09-07 DIAGNOSIS — I10 Essential (primary) hypertension: Secondary | ICD-10-CM | POA: Diagnosis not present

## 2022-09-12 DIAGNOSIS — L281 Prurigo nodularis: Secondary | ICD-10-CM | POA: Diagnosis not present

## 2022-10-14 DIAGNOSIS — Z03818 Encounter for observation for suspected exposure to other biological agents ruled out: Secondary | ICD-10-CM | POA: Diagnosis not present

## 2022-10-14 DIAGNOSIS — U071 COVID-19: Secondary | ICD-10-CM | POA: Diagnosis not present

## 2022-10-14 DIAGNOSIS — I10 Essential (primary) hypertension: Secondary | ICD-10-CM | POA: Diagnosis not present

## 2022-10-14 DIAGNOSIS — Z20822 Contact with and (suspected) exposure to covid-19: Secondary | ICD-10-CM | POA: Diagnosis not present

## 2022-10-14 DIAGNOSIS — Z6825 Body mass index (BMI) 25.0-25.9, adult: Secondary | ICD-10-CM | POA: Diagnosis not present

## 2022-11-01 DIAGNOSIS — E7800 Pure hypercholesterolemia, unspecified: Secondary | ICD-10-CM | POA: Diagnosis not present

## 2022-11-01 DIAGNOSIS — E1165 Type 2 diabetes mellitus with hyperglycemia: Secondary | ICD-10-CM | POA: Diagnosis not present

## 2022-11-01 DIAGNOSIS — I10 Essential (primary) hypertension: Secondary | ICD-10-CM | POA: Diagnosis not present

## 2023-03-01 DIAGNOSIS — M65331 Trigger finger, right middle finger: Secondary | ICD-10-CM | POA: Diagnosis not present

## 2023-03-01 DIAGNOSIS — M79642 Pain in left hand: Secondary | ICD-10-CM | POA: Diagnosis not present

## 2023-03-01 DIAGNOSIS — G5603 Carpal tunnel syndrome, bilateral upper limbs: Secondary | ICD-10-CM | POA: Diagnosis not present

## 2023-03-01 DIAGNOSIS — M79641 Pain in right hand: Secondary | ICD-10-CM | POA: Diagnosis not present

## 2023-03-11 DIAGNOSIS — G5621 Lesion of ulnar nerve, right upper limb: Secondary | ICD-10-CM | POA: Diagnosis not present

## 2023-03-11 DIAGNOSIS — G5603 Carpal tunnel syndrome, bilateral upper limbs: Secondary | ICD-10-CM | POA: Diagnosis not present

## 2023-03-11 DIAGNOSIS — G5622 Lesion of ulnar nerve, left upper limb: Secondary | ICD-10-CM | POA: Diagnosis not present

## 2023-03-17 DIAGNOSIS — E113293 Type 2 diabetes mellitus with mild nonproliferative diabetic retinopathy without macular edema, bilateral: Secondary | ICD-10-CM | POA: Diagnosis not present

## 2023-03-17 DIAGNOSIS — E7800 Pure hypercholesterolemia, unspecified: Secondary | ICD-10-CM | POA: Diagnosis not present

## 2023-03-17 DIAGNOSIS — E1165 Type 2 diabetes mellitus with hyperglycemia: Secondary | ICD-10-CM | POA: Diagnosis not present

## 2023-03-17 DIAGNOSIS — Z23 Encounter for immunization: Secondary | ICD-10-CM | POA: Diagnosis not present

## 2023-03-17 DIAGNOSIS — I10 Essential (primary) hypertension: Secondary | ICD-10-CM | POA: Diagnosis not present

## 2023-03-23 DIAGNOSIS — R5383 Other fatigue: Secondary | ICD-10-CM | POA: Diagnosis not present

## 2023-03-23 DIAGNOSIS — E1165 Type 2 diabetes mellitus with hyperglycemia: Secondary | ICD-10-CM | POA: Diagnosis not present

## 2023-03-23 DIAGNOSIS — E113293 Type 2 diabetes mellitus with mild nonproliferative diabetic retinopathy without macular edema, bilateral: Secondary | ICD-10-CM | POA: Diagnosis not present

## 2023-03-23 DIAGNOSIS — I10 Essential (primary) hypertension: Secondary | ICD-10-CM | POA: Diagnosis not present

## 2023-03-23 DIAGNOSIS — D751 Secondary polycythemia: Secondary | ICD-10-CM | POA: Diagnosis not present

## 2023-03-23 DIAGNOSIS — Z125 Encounter for screening for malignant neoplasm of prostate: Secondary | ICD-10-CM | POA: Diagnosis not present

## 2023-03-23 DIAGNOSIS — E782 Mixed hyperlipidemia: Secondary | ICD-10-CM | POA: Diagnosis not present

## 2023-03-30 DIAGNOSIS — E782 Mixed hyperlipidemia: Secondary | ICD-10-CM | POA: Diagnosis not present

## 2023-03-30 DIAGNOSIS — E113293 Type 2 diabetes mellitus with mild nonproliferative diabetic retinopathy without macular edema, bilateral: Secondary | ICD-10-CM | POA: Diagnosis not present

## 2023-03-30 DIAGNOSIS — Z23 Encounter for immunization: Secondary | ICD-10-CM | POA: Diagnosis not present

## 2023-03-30 DIAGNOSIS — I10 Essential (primary) hypertension: Secondary | ICD-10-CM | POA: Diagnosis not present

## 2023-03-30 DIAGNOSIS — D751 Secondary polycythemia: Secondary | ICD-10-CM | POA: Diagnosis not present

## 2023-03-30 DIAGNOSIS — Z0000 Encounter for general adult medical examination without abnormal findings: Secondary | ICD-10-CM | POA: Diagnosis not present

## 2023-03-30 DIAGNOSIS — N182 Chronic kidney disease, stage 2 (mild): Secondary | ICD-10-CM | POA: Diagnosis not present

## 2023-04-12 DIAGNOSIS — G5603 Carpal tunnel syndrome, bilateral upper limbs: Secondary | ICD-10-CM | POA: Diagnosis not present

## 2023-04-12 DIAGNOSIS — M65352 Trigger finger, left little finger: Secondary | ICD-10-CM | POA: Diagnosis not present

## 2023-05-11 DIAGNOSIS — L730 Acne keloid: Secondary | ICD-10-CM | POA: Diagnosis not present

## 2023-06-11 DIAGNOSIS — J111 Influenza due to unidentified influenza virus with other respiratory manifestations: Secondary | ICD-10-CM | POA: Diagnosis not present

## 2023-06-11 DIAGNOSIS — J22 Unspecified acute lower respiratory infection: Secondary | ICD-10-CM | POA: Diagnosis not present

## 2023-06-11 DIAGNOSIS — R0981 Nasal congestion: Secondary | ICD-10-CM | POA: Diagnosis not present

## 2023-06-21 DIAGNOSIS — G5602 Carpal tunnel syndrome, left upper limb: Secondary | ICD-10-CM | POA: Diagnosis not present

## 2023-07-06 DIAGNOSIS — G5602 Carpal tunnel syndrome, left upper limb: Secondary | ICD-10-CM | POA: Diagnosis not present

## 2023-07-07 DIAGNOSIS — H04123 Dry eye syndrome of bilateral lacrimal glands: Secondary | ICD-10-CM | POA: Diagnosis not present

## 2023-07-07 DIAGNOSIS — H524 Presbyopia: Secondary | ICD-10-CM | POA: Diagnosis not present

## 2023-07-07 DIAGNOSIS — H25813 Combined forms of age-related cataract, bilateral: Secondary | ICD-10-CM | POA: Diagnosis not present

## 2023-07-07 DIAGNOSIS — E113291 Type 2 diabetes mellitus with mild nonproliferative diabetic retinopathy without macular edema, right eye: Secondary | ICD-10-CM | POA: Diagnosis not present

## 2023-07-13 DIAGNOSIS — L730 Acne keloid: Secondary | ICD-10-CM | POA: Diagnosis not present

## 2023-07-26 DIAGNOSIS — M5416 Radiculopathy, lumbar region: Secondary | ICD-10-CM | POA: Diagnosis not present

## 2023-07-29 DIAGNOSIS — M5416 Radiculopathy, lumbar region: Secondary | ICD-10-CM | POA: Diagnosis not present

## 2023-08-08 DIAGNOSIS — E1165 Type 2 diabetes mellitus with hyperglycemia: Secondary | ICD-10-CM | POA: Diagnosis not present

## 2023-08-08 DIAGNOSIS — E7800 Pure hypercholesterolemia, unspecified: Secondary | ICD-10-CM | POA: Diagnosis not present

## 2023-08-08 DIAGNOSIS — I10 Essential (primary) hypertension: Secondary | ICD-10-CM | POA: Diagnosis not present

## 2023-08-31 ENCOUNTER — Other Ambulatory Visit: Payer: Self-pay | Admitting: Physical Medicine and Rehabilitation

## 2023-08-31 DIAGNOSIS — M5459 Other low back pain: Secondary | ICD-10-CM

## 2023-09-05 DIAGNOSIS — I10 Essential (primary) hypertension: Secondary | ICD-10-CM | POA: Diagnosis not present

## 2023-09-05 DIAGNOSIS — E7800 Pure hypercholesterolemia, unspecified: Secondary | ICD-10-CM | POA: Diagnosis not present

## 2023-09-05 DIAGNOSIS — Z4681 Encounter for fitting and adjustment of insulin pump: Secondary | ICD-10-CM | POA: Diagnosis not present

## 2023-09-05 DIAGNOSIS — E1165 Type 2 diabetes mellitus with hyperglycemia: Secondary | ICD-10-CM | POA: Diagnosis not present

## 2023-09-06 ENCOUNTER — Ambulatory Visit
Admission: RE | Admit: 2023-09-06 | Discharge: 2023-09-06 | Disposition: A | Discharge status: Home / Self Care | Payer: PPO | Source: Ambulatory Visit | Attending: Physical Medicine and Rehabilitation | Admitting: Physical Medicine and Rehabilitation

## 2023-09-06 DIAGNOSIS — M5459 Other low back pain: Secondary | ICD-10-CM

## 2023-09-06 DIAGNOSIS — L730 Acne keloid: Secondary | ICD-10-CM | POA: Diagnosis not present

## 2023-09-06 DIAGNOSIS — L309 Dermatitis, unspecified: Secondary | ICD-10-CM | POA: Diagnosis not present

## 2023-09-07 DIAGNOSIS — I10 Essential (primary) hypertension: Secondary | ICD-10-CM | POA: Diagnosis not present

## 2023-09-07 DIAGNOSIS — M5416 Radiculopathy, lumbar region: Secondary | ICD-10-CM | POA: Diagnosis not present

## 2023-09-07 DIAGNOSIS — E113293 Type 2 diabetes mellitus with mild nonproliferative diabetic retinopathy without macular edema, bilateral: Secondary | ICD-10-CM | POA: Diagnosis not present

## 2023-09-07 DIAGNOSIS — D751 Secondary polycythemia: Secondary | ICD-10-CM | POA: Diagnosis not present

## 2023-09-07 DIAGNOSIS — N182 Chronic kidney disease, stage 2 (mild): Secondary | ICD-10-CM | POA: Diagnosis not present

## 2023-09-07 DIAGNOSIS — E782 Mixed hyperlipidemia: Secondary | ICD-10-CM | POA: Diagnosis not present

## 2023-11-02 DIAGNOSIS — L730 Acne keloid: Secondary | ICD-10-CM | POA: Diagnosis not present

## 2023-12-11 DIAGNOSIS — E1165 Type 2 diabetes mellitus with hyperglycemia: Secondary | ICD-10-CM | POA: Diagnosis not present

## 2023-12-11 DIAGNOSIS — I10 Essential (primary) hypertension: Secondary | ICD-10-CM | POA: Diagnosis not present

## 2023-12-11 DIAGNOSIS — Z4681 Encounter for fitting and adjustment of insulin pump: Secondary | ICD-10-CM | POA: Diagnosis not present

## 2024-01-12 ENCOUNTER — Emergency Department (HOSPITAL_BASED_OUTPATIENT_CLINIC_OR_DEPARTMENT_OTHER): Admitting: Radiology

## 2024-01-12 ENCOUNTER — Emergency Department (HOSPITAL_BASED_OUTPATIENT_CLINIC_OR_DEPARTMENT_OTHER)
Admission: EM | Admit: 2024-01-12 | Discharge: 2024-01-12 | Disposition: A | Discharge status: Home / Self Care | Attending: Emergency Medicine | Admitting: Emergency Medicine

## 2024-01-12 ENCOUNTER — Encounter (HOSPITAL_BASED_OUTPATIENT_CLINIC_OR_DEPARTMENT_OTHER): Payer: Self-pay | Admitting: Emergency Medicine

## 2024-01-12 ENCOUNTER — Other Ambulatory Visit: Payer: Self-pay

## 2024-01-12 DIAGNOSIS — E119 Type 2 diabetes mellitus without complications: Secondary | ICD-10-CM | POA: Diagnosis not present

## 2024-01-12 DIAGNOSIS — Z7982 Long term (current) use of aspirin: Secondary | ICD-10-CM | POA: Diagnosis not present

## 2024-01-12 DIAGNOSIS — Z79899 Other long term (current) drug therapy: Secondary | ICD-10-CM | POA: Diagnosis not present

## 2024-01-12 DIAGNOSIS — R0789 Other chest pain: Secondary | ICD-10-CM | POA: Insufficient documentation

## 2024-01-12 DIAGNOSIS — I10 Essential (primary) hypertension: Secondary | ICD-10-CM | POA: Insufficient documentation

## 2024-01-12 DIAGNOSIS — Z7984 Long term (current) use of oral hypoglycemic drugs: Secondary | ICD-10-CM | POA: Insufficient documentation

## 2024-01-12 DIAGNOSIS — R079 Chest pain, unspecified: Secondary | ICD-10-CM

## 2024-01-12 DIAGNOSIS — Z794 Long term (current) use of insulin: Secondary | ICD-10-CM | POA: Diagnosis not present

## 2024-01-12 LAB — RESP PANEL BY RT-PCR (RSV, FLU A&B, COVID)  RVPGX2
Influenza A by PCR: NEGATIVE
Influenza B by PCR: NEGATIVE
Resp Syncytial Virus by PCR: NEGATIVE
SARS Coronavirus 2 by RT PCR: NEGATIVE

## 2024-01-12 LAB — BASIC METABOLIC PANEL WITH GFR
Anion gap: 11 (ref 5–15)
BUN: 19 mg/dL (ref 8–23)
CO2: 23 mmol/L (ref 22–32)
Calcium: 10.1 mg/dL (ref 8.9–10.3)
Chloride: 106 mmol/L (ref 98–111)
Creatinine, Ser: 1.08 mg/dL (ref 0.61–1.24)
GFR, Estimated: >60 mL/min (ref >60)
Glucose, Bld: 114 mg/dL — ABNORMAL HIGH (ref 70–99)
Potassium: 3.5 mmol/L (ref 3.5–5.1)
Sodium: 140 mmol/L (ref 135–145)

## 2024-01-12 LAB — CBC
HCT: 45.9 % (ref 39.0–52.0)
Hemoglobin: 14.7 g/dL (ref 13.0–17.0)
MCH: 25.7 pg — ABNORMAL LOW (ref 26.0–34.0)
MCHC: 32 g/dL (ref 30.0–36.0)
MCV: 80.4 fL (ref 80.0–100.0)
Platelets: 149 K/uL — ABNORMAL LOW (ref 150–400)
RBC: 5.71 MIL/uL (ref 4.22–5.81)
RDW: 13.7 % (ref 11.5–15.5)
WBC: 4.1 K/uL (ref 4.0–10.5)
nRBC: 0 % (ref 0.0–0.2)

## 2024-01-12 LAB — TROPONIN T, HIGH SENSITIVITY
Troponin T High Sensitivity: 17 ng/L (ref <19)
Troponin T High Sensitivity: <15 ng/L (ref <19)

## 2024-01-12 NOTE — ED Triage Notes (Signed)
 Chest pain Central chest pain heaviness, discomfort Denies sob Seen at [facility] and sent for eval

## 2024-01-12 NOTE — Discharge Instructions (Addendum)
 Return to the ER for recurrent episodes or other concerning symptoms.  Follow-up with a cardiologist as we discussed for further evaluation.

## 2024-01-12 NOTE — ED Provider Notes (Addendum)
 [place] EMERGENCY DEPARTMENT AT [facility] Provider Note   CSN: [id number] Arrival date & time: 01/12/24  1857     Patient presents with: Chest Pain   Howard Cook is a 68 y.o. male.    Chest Pain    Patient has a history of hypertension hyperlipidemia acid reflux diabetes chest pain.  Patient denies any history of heart disease.  No history of PE or DVT.  He woke up today with complaints of some generalized malaise.  Patient was not having any fevers or chills.  No cough no shortness of breath.  He started developing vague discomfort in his chest earlier this afternoon.  The symptoms persisted.  He went to an urgent care today and was instructed to come to the ED for further evaluation.  Prior to Admission medications   Medication Sig Start Date End Date Taking? Authorizing Provider  acyclovir (ZOVIRAX) 800 MG tablet Take 800 mg by mouth as needed.    [provider]  aspirin EC 81 MG tablet Take 81 mg by mouth daily. Swallow whole.    [provider]  glimepiride (AMARYL) 1 MG tablet Take 1 mg by mouth daily with breakfast.    [provider]  insulin aspart (NOVOLOG) 100 UNIT/ML injection Inject 8 Units into the skin 3 (three) times daily before meals. 3 to 8 units once daily    [provider]  Insulin Degludec (TRESIBA [place]) Inject 8 Units into the skin daily.    [provider]  losartan-hydrochlorothiazide (HYZAAR) 100-25 MG tablet Take 1 tablet by mouth daily.    [provider]  Multiple Vitamin (MULTIVITAMIN) capsule Take 1 capsule by mouth daily.    [provider]  rosuvastatin (CRESTOR) 40 MG tablet Take 40 mg by mouth daily.    [provider]  VIAGRA 100 MG tablet  12/24/10   [provider]    Allergies: Oxycodone    Review of Systems  Cardiovascular:  Positive for chest pain.    Updated Vital Signs BP (!) 148/93 (BP Location: Right Arm)   Pulse 74   Temp 98.9 F (37.2  C) (Oral)   Resp 18   Ht 1.803 m (5' 11)   Wt 80.3 kg   SpO2 100%   BMI 24.69 kg/m   Physical Exam Vitals and nursing note reviewed.  Constitutional:      General: He is not in acute distress.    Appearance: He is well-developed.  HENT:     Head: Normocephalic and atraumatic.     Right Ear: External ear normal.     Left Ear: External ear normal.  Eyes:     General: No scleral icterus.       Right eye: No discharge.        Left eye: No discharge.     Conjunctiva/sclera: Conjunctivae normal.  Neck:     Trachea: No tracheal deviation.  Cardiovascular:     Rate and Rhythm: Normal rate and regular rhythm.  Pulmonary:     Effort: Pulmonary effort is normal. No respiratory distress.     Breath sounds: Normal breath sounds. No stridor. No wheezing or rales.  Abdominal:     General: Bowel sounds are normal. There is no distension.     Palpations: Abdomen is soft.     Tenderness: There is no abdominal tenderness. There is no guarding or rebound.  Musculoskeletal:        General: No tenderness or deformity.     Cervical  back: Neck supple.  Skin:    General: Skin is warm and dry.     Findings: No rash.  Neurological:     General: No focal deficit present.     Mental Status: He is alert.     Cranial Nerves: No cranial nerve deficit, dysarthria or facial asymmetry.     Sensory: No sensory deficit.     Motor: No abnormal muscle tone or seizure activity.     Coordination: Coordination normal.  Psychiatric:        Mood and Affect: Mood normal.     (all labs ordered are listed, but only abnormal results are displayed) Labs Reviewed  BASIC METABOLIC PANEL WITH GFR - Abnormal; Notable for the following components:      Result Value   Glucose, Bld 114 (*)    All other components within normal limits  CBC - Abnormal; Notable for the following components:   MCH 25.7 (*)    Platelets 149 (*)    All other components within normal limits  RESP PANEL BY RT-PCR (RSV, FLU A&B,  COVID)  [id number]  TROPONIN T, HIGH SENSITIVITY  TROPONIN T, HIGH SENSITIVITY    EKG: EKG Interpretation Date/Time:  Friday January 12 2024 19:11:55 EDT Ventricular Rate:  69 PR Interval:  184 QRS Duration:  80 QT Interval:  368 QTC Calculation: 394 R Axis:   0  Text Interpretation: Normal sinus rhythm Normal ECG When compared with ECG of 04-Sep-2009 07:15, No significant change was found Confirmed by Randol Simmonds [phone]) on 01/12/2024 7:19:42 PM  Radiology: [facility] Chest 2 View Result Date: 01/12/2024 CLINICAL DATA:  Chest pain EXAM: CHEST - 2 VIEW COMPARISON:  02/17/2011 FINDINGS: Cardiac shadow is within normal limits. The lungs are well aerated bilaterally. No focal infiltrate or effusion is seen. No bony abnormality is noted. IMPRESSION: No active cardiopulmonary disease. Electronically Signed   By: Oneil Devonshire M.D.   On: 01/12/2024 20:32     Procedures   Medications Ordered in the ED - No data to display  Clinical Course as of 01/12/24 2144  Fri Jan 12, 2024  2139 COVID flu RSV negative troponin normal.  CBC and metabolic panel unremarkable. [JK]  2140 DG Chest 2 View Chest x-ray without acute findings [JK]    Clinical Course User Index [JK] Randol Simmonds, MD             HEART Score: 4                    Medical Decision Making Problems Addressed: Chest pain, unspecified type: acute illness or injury that poses a threat to life or bodily functions  Amount and/or Complexity of Data Reviewed Labs: ordered. Decision-making details documented in ED Course. Radiology: ordered and independent interpretation performed. Decision-making details documented in ED Course.   Patient presented to the ED for evaluation of vague chest discomfort.  Patient with no known history of heart disease but he does have cardiac risk factors moderate heart score.  ED workup reassuring.  Serial troponins are normal.  Doubt ACS.  Patient x-ray does not show any signs of pneumonia.  Patient symptoms have  resolved.  Will have patient follow-up with his PCP and will place a cardiology referral     Final diagnoses:  Chest pain, unspecified type    ED Discharge Orders          Ordered    Ambulatory referral to Cardiology       Comments: If you have  not heard from the Cardiology office within the next 72 hours please call [phone].   01/12/24 2144               Randol Simmonds, MD 01/12/24 7855    Randol Simmonds, MD 01/12/24 2145

## 2024-02-15 DIAGNOSIS — I10 Essential (primary) hypertension: Secondary | ICD-10-CM | POA: Diagnosis not present

## 2024-03-25 DIAGNOSIS — Z9889 Other specified postprocedural states: Secondary | ICD-10-CM | POA: Diagnosis not present

## 2024-03-25 DIAGNOSIS — M79642 Pain in left hand: Secondary | ICD-10-CM | POA: Diagnosis not present

## 2024-03-25 DIAGNOSIS — M25511 Pain in right shoulder: Secondary | ICD-10-CM | POA: Diagnosis not present

## 2024-03-25 DIAGNOSIS — G8929 Other chronic pain: Secondary | ICD-10-CM | POA: Diagnosis not present

## 2024-03-25 DIAGNOSIS — M25512 Pain in left shoulder: Secondary | ICD-10-CM | POA: Diagnosis not present

## 2024-03-26 DIAGNOSIS — M65332 Trigger finger, left middle finger: Secondary | ICD-10-CM | POA: Diagnosis not present

## 2024-03-26 DIAGNOSIS — M65352 Trigger finger, left little finger: Secondary | ICD-10-CM | POA: Diagnosis not present

## 2024-04-02 NOTE — Progress Notes (Unsigned)
 Cardiology Office Note:    Date:  04/02/2024   ID:  Howard Cook, DOB 1955/12/26, MRN [id number]  PCP:  Verdia Lombard, MD  Cardiologist:  None  Electrophysiologist:  None   Referring MD: Randol Simmonds, MD   No chief complaint on file. ***  History of Present Illness:    Howard Cook is a 68 y.o. male with a hx of diabetes, hypertension, hyperlipidemia, OSA who presents as an ED follow-up for chest pain.  He was seen for chest pain in ED 01/12/2024, workup was unremarkable  Past Medical History:  Diagnosis Date   Acid reflux    Chest pain    Diabetes mellitus 2003   Pt reports a corrected diagnosis to Type 1 DM (from T2DM) by Dr. Littie Caffey 3 years ago   Hyperlipidemia    Hypertension    LBP (low back pain)    Left-sided trigeminal neuralgia    Sleep apnea     Past Surgical History:  Procedure Laterality Date   ESOPHAGOGASTRIC FUNDOPLICATION  02/24/2011   HIATAL HERNIA REPAIR  23Aug2012   With NIssen Fundoplication   TONSILLECTOMY AND ADENOIDECTOMY      Current Medications: No outpatient medications have been marked as taking for the 04/03/24 encounter (Appointment) with Kate Lonni CROME, MD.     Allergies:   Oxycodone   Social History   Socioeconomic History   Marital status: Divorced    Spouse name: Not on file   Number of children: 2   Years of education: college   Highest education level: Bachelor's degree (e.g., BA, AB, BS)  Occupational History   Occupation: Retired  Tobacco Use   Smoking status: Former    Current packs/day: 0.00    Types: Cigarettes    Quit date: 10/26/1998    Years since quitting: 25.4   Smokeless tobacco: Never  Substance and Sexual Activity   Alcohol use: Yes    Comment: occasional use   Drug use: Never   Sexual activity: Not on file  Other Topics Concern   Not on file  Social History Narrative   Lives alone.   Right-handed.   1-2 cups caffeine daily.   Social Drivers of Corporate investment banker  Strain: Not on file  Food Insecurity: Not on file  Transportation Needs: Not on file  Physical Activity: Not on file  Stress: Not on file  Social Connections: Not on file     Family History: The patient's ***family history includes Other in his father and mother; Stroke in his brother.  ROS:   Please see the history of present illness.    *** All other systems reviewed and are negative.  EKGs/Labs/Other Studies Reviewed:    The following studies were reviewed today: ***  EKG:  EKG is *** ordered today.  The ekg ordered today demonstrates ***  Recent Labs: 01/12/2024: BUN 19; Creatinine, Ser 1.08; Hemoglobin 14.7; Platelets 149; Potassium 3.5; Sodium 140  Recent Lipid Panel    Component Value Date/Time   CHOL  09/04/2009 0017    156        ATP III CLASSIFICATION:  <200     mg/dL   Desirable  799-760  mg/dL   Borderline High  >=759    mg/dL   High          TRIG 857 09/04/2009 0017   HDL 40 09/04/2009 0017   CHOLHDL 3.9 09/04/2009 0017   VLDL 28 09/04/2009 0017   LDLCALC  09/04/2009 0017    88  Total Cholesterol/HDL:CHD Risk Coronary Heart Disease Risk Table                     Men   Women  1/2 Average Risk   3.4   3.3  Average Risk       5.0   4.4  2 X Average Risk   9.6   7.1  3 X Average Risk  23.4   11.0        Use the calculated Patient Ratio above and the CHD Risk Table to determine the patient's CHD Risk.        ATP III CLASSIFICATION (LDL):  <100     mg/dL   Optimal  899-870  mg/dL   Near or Above                    Optimal  130-159  mg/dL   Borderline  839-810  mg/dL   High  >809     mg/dL   Very High    Physical Exam:    VS:  There were no vitals taken for this visit.    Wt Readings from Last 3 Encounters:  01/12/24 177 lb (80.3 kg)  07/08/22 179 lb 9.6 oz (81.5 kg)  04/05/22 184 lb 3.2 oz (83.6 kg)     GEN: *** Well nourished, well developed in no acute distress HEENT: Normal NECK: No JVD; No carotid bruits LYMPHATICS: No  lymphadenopathy CARDIAC: ***RRR, no murmurs, rubs, gallops RESPIRATORY:  Clear to auscultation without rales, wheezing or rhonchi  ABDOMEN: Soft, non-tender, non-distended MUSCULOSKELETAL:  No edema; No deformity  SKIN: Warm and dry NEUROLOGIC:  Alert and oriented x 3 PSYCHIATRIC:  Normal affect   ASSESSMENT:    No diagnosis found. PLAN:    Chest pain:  Hypertension: On losartan as HCTZ 100-25 mg daily  Hyperlipidemia: On rosuvastatin 40 mg daily  Diabetes: On insulin  RTC in***   Medication Adjustments/Labs and Tests Ordered: Current medicines are reviewed at length with the patient today.  Concerns regarding medicines are outlined above.  No orders of the defined types were placed in this encounter.  No orders of the defined types were placed in this encounter.   There are no Patient Instructions on file for this visit.   Signed, Lonni LITTIE Nanas, MD  04/02/2024 10:54 PM    [facility]

## 2024-04-03 ENCOUNTER — Ambulatory Visit (INDEPENDENT_AMBULATORY_CARE_PROVIDER_SITE_OTHER): Admitting: Cardiology

## 2024-04-03 VITALS — BP 132/70 | HR 74 | Wt 180.5 lb

## 2024-04-03 DIAGNOSIS — E785 Hyperlipidemia, unspecified: Secondary | ICD-10-CM | POA: Diagnosis not present

## 2024-04-03 DIAGNOSIS — I10 Essential (primary) hypertension: Secondary | ICD-10-CM | POA: Diagnosis not present

## 2024-04-03 DIAGNOSIS — R072 Precordial pain: Secondary | ICD-10-CM

## 2024-04-03 LAB — BASIC METABOLIC PANEL WITH GFR
BUN/Creatinine Ratio: 15 (ref 10–24)
BUN: 16 mg/dL (ref 8–27)
CO2: 22 mmol/L (ref 20–29)
Calcium: 9.4 mg/dL (ref 8.6–10.2)
Chloride: 104 mmol/L (ref 96–106)
Creatinine, Ser: 1.06 mg/dL (ref 0.76–1.27)
Glucose: 217 mg/dL — ABNORMAL HIGH (ref 70–99)
Potassium: 4.3 mmol/L (ref 3.5–5.2)
Sodium: 140 mmol/L (ref 134–144)
eGFR: 76 mL/min/1.73 (ref >59)

## 2024-04-03 MED ORDER — METOPROLOL TARTRATE 100 MG PO TABS: 100.0000 mg | ORAL_TABLET | Freq: Once | ORAL | 0 refills | Status: AC

## 2024-04-03 NOTE — Patient Instructions (Addendum)
 Medication Instructions:  No changes *If you need a refill on your cardiac medications before your next appointment, please call your pharmacy*  Lab Work: Today: bmet If you have labs (blood work) drawn today and your tests are completely normal, you will receive your results only by: MyChart Message (if you have MyChart) OR A paper copy in the mail If you have any lab test that is abnormal or we need to change your treatment, we will call you to review the results.  Testing/Procedures: Your physician has requested that you have an echocardiogram. Echocardiography is a painless test that uses sound waves to create images of your heart. It provides your doctor with information about the size and shape of your heart and how well your heart's chambers and valves are working. This procedure takes approximately one hour. There are no restrictions for this procedure. Please do NOT wear cologne, perfume, aftershave, or lotions (deodorant is allowed). Please arrive 15 minutes prior to your appointment time.  Please note: We ask at that you not bring children with you during ultrasound (echo/ vascular) testing. Due to room size and safety concerns, children are not allowed in the ultrasound rooms during exams. Our front office staff cannot provide observation of children in our lobby area while testing is being conducted. An adult accompanying a patient to their appointment will only be allowed in the ultrasound room at the discretion of the ultrasound technician under special circumstances. We apologize for any inconvenience.  Coronary CT Angiogram - see instructions below  Follow-Up: At [facility], you and your health needs are our priority.  As part of our continuing mission to provide you with exceptional heart care, our providers are all part of one team.  This team includes your primary Cardiologist (physician) and Advanced Practice Providers or APPs (Physician Assistants and Nurse  Practitioners) who all work together to provide you with the care you need, when you need it.  Your next appointment:   4 month(s)  Provider:   Lonni LITTIE Nanas, MD       Your cardiac CT will be scheduled at one of the below locations:   [facility] [address]  [place], [place] [address] [phone]   If scheduled at the Heart and Vascular Tower at [facility] [address], please enter the parking lot using the [address] entrance and use the FREE valet service at the patient drop-off area. Enter the building and check-in with registration on the main floor.  Please follow these instructions carefully (unless otherwise directed):  An IV will be required for this test and Nitroglycerin will be given.  Hold all erectile dysfunction medications at least 3 days (72 hrs) prior to test. (Ie viagra, cialis, sildenafil, tadalafil, etc)   On the Night Before the Test: Be sure to Drink plenty of water. Do not consume any caffeinated/decaffeinated beverages or chocolate 12 hours prior to your test. Do not take any antihistamines 12 hours prior to your test.  On the Day of the Test: Drink plenty of water until 1 hour prior to the test. Do not eat any food 1 hour prior to test. You may take your regular medications prior to the test.  Take metoprolol (Lopressor) two hours prior to test. Please HOLD lisinopril-hydrochlorothiazide on the morning of the test. Patients who wear a continuous glucose monitor MUST remove the device prior to scanning.  After the Test: Drink plenty of water. After receiving IV contrast, you may experience a  mild flushed feeling. This is normal. On occasion, you may experience a mild rash up to 24 hours after the test. This is not dangerous. If this occurs, you can take Benadryl 25 mg, Zyrtec, Claritin, or Allegra and increase your fluid intake. (Patients taking Tikosyn should avoid Benadryl, and may take Zyrtec, Claritin, or  Allegra) If you experience trouble breathing, this can be serious. If it is severe call 911 IMMEDIATELY. If it is mild, please call our office.  We will call to schedule your test 2-4 weeks out understanding that some insurance companies will need an authorization prior to the service being performed.   For more information and frequently asked questions, please visit our website : [email or web address]  For non-scheduling related questions, please contact the cardiac imaging nurse navigator should you have any questions/concerns: Cardiac Imaging Nurse Navigators Direct Office Dial: [phone]   For scheduling needs, including cancellations and rescheduling, please call [place], [phone].

## 2024-04-05 ENCOUNTER — Ambulatory Visit: Payer: Self-pay | Admitting: Cardiology

## 2024-04-05 DIAGNOSIS — D751 Secondary polycythemia: Secondary | ICD-10-CM | POA: Diagnosis not present

## 2024-04-05 DIAGNOSIS — E782 Mixed hyperlipidemia: Secondary | ICD-10-CM | POA: Diagnosis not present

## 2024-04-05 DIAGNOSIS — I10 Essential (primary) hypertension: Secondary | ICD-10-CM | POA: Diagnosis not present

## 2024-04-05 DIAGNOSIS — N182 Chronic kidney disease, stage 2 (mild): Secondary | ICD-10-CM | POA: Diagnosis not present

## 2024-04-05 DIAGNOSIS — Z125 Encounter for screening for malignant neoplasm of prostate: Secondary | ICD-10-CM | POA: Diagnosis not present

## 2024-04-05 DIAGNOSIS — E1165 Type 2 diabetes mellitus with hyperglycemia: Secondary | ICD-10-CM | POA: Diagnosis not present

## 2024-04-11 ENCOUNTER — Ambulatory Visit (HOSPITAL_COMMUNITY)

## 2024-04-11 DIAGNOSIS — E782 Mixed hyperlipidemia: Secondary | ICD-10-CM | POA: Diagnosis not present

## 2024-04-11 DIAGNOSIS — E113293 Type 2 diabetes mellitus with mild nonproliferative diabetic retinopathy without macular edema, bilateral: Secondary | ICD-10-CM | POA: Diagnosis not present

## 2024-04-11 DIAGNOSIS — Z0000 Encounter for general adult medical examination without abnormal findings: Secondary | ICD-10-CM | POA: Diagnosis not present

## 2024-04-11 DIAGNOSIS — D751 Secondary polycythemia: Secondary | ICD-10-CM | POA: Diagnosis not present

## 2024-04-11 DIAGNOSIS — N182 Chronic kidney disease, stage 2 (mild): Secondary | ICD-10-CM | POA: Diagnosis not present

## 2024-04-11 DIAGNOSIS — I10 Essential (primary) hypertension: Secondary | ICD-10-CM | POA: Diagnosis not present

## 2024-04-12 ENCOUNTER — Encounter (HOSPITAL_COMMUNITY): Payer: Self-pay

## 2024-04-15 ENCOUNTER — Ambulatory Visit (HOSPITAL_COMMUNITY)
Admission: RE | Admit: 2024-04-15 | Discharge: 2024-04-15 | Disposition: A | Discharge status: Home / Self Care | Source: Ambulatory Visit | Attending: Cardiology | Admitting: Cardiology

## 2024-04-15 DIAGNOSIS — I700 Atherosclerosis of aorta: Secondary | ICD-10-CM | POA: Insufficient documentation

## 2024-04-15 DIAGNOSIS — R072 Precordial pain: Secondary | ICD-10-CM | POA: Diagnosis not present

## 2024-04-15 MED ORDER — IOHEXOL 350 MG/ML SOLN
100.0000 mL | Freq: Once | INTRAVENOUS | Status: AC | PRN
  Administered 2024-04-15: 100 mL via INTRAVENOUS

## 2024-04-15 MED ORDER — NITROGLYCERIN 0.4 MG SL SUBL
0.8000 mg | SUBLINGUAL_TABLET | Freq: Once | SUBLINGUAL | Status: AC
  Administered 2024-04-15: 0.8 mg via SUBLINGUAL

## 2024-04-18 DIAGNOSIS — Z23 Encounter for immunization: Secondary | ICD-10-CM | POA: Diagnosis not present

## 2024-04-18 DIAGNOSIS — Z4681 Encounter for fitting and adjustment of insulin pump: Secondary | ICD-10-CM | POA: Diagnosis not present

## 2024-04-18 DIAGNOSIS — R196 Halitosis: Secondary | ICD-10-CM | POA: Diagnosis not present

## 2024-04-18 DIAGNOSIS — E782 Mixed hyperlipidemia: Secondary | ICD-10-CM | POA: Diagnosis not present

## 2024-04-18 DIAGNOSIS — I10 Essential (primary) hypertension: Secondary | ICD-10-CM | POA: Diagnosis not present

## 2024-04-18 DIAGNOSIS — E1165 Type 2 diabetes mellitus with hyperglycemia: Secondary | ICD-10-CM | POA: Diagnosis not present

## 2024-04-18 DIAGNOSIS — L730 Acne keloid: Secondary | ICD-10-CM | POA: Diagnosis not present

## 2024-04-18 DIAGNOSIS — E7800 Pure hypercholesterolemia, unspecified: Secondary | ICD-10-CM | POA: Diagnosis not present

## 2024-04-18 DIAGNOSIS — K219 Gastro-esophageal reflux disease without esophagitis: Secondary | ICD-10-CM | POA: Diagnosis not present

## 2024-05-01 ENCOUNTER — Other Ambulatory Visit (INDEPENDENT_AMBULATORY_CARE_PROVIDER_SITE_OTHER)

## 2024-05-01 DIAGNOSIS — R072 Precordial pain: Secondary | ICD-10-CM | POA: Diagnosis not present

## 2024-05-01 LAB — ECHOCARDIOGRAM COMPLETE
Area-P 1/2: 2.95 cm2
S' Lateral: 1.46 cm

## 2024-05-07 ENCOUNTER — Telehealth: Payer: Self-pay | Admitting: Cardiology

## 2024-05-07 NOTE — Telephone Encounter (Signed)
 Spoke with pt and went over echo and CCTA results. Pt verbalized understanding of results and had no further questions or concerns at this time.

## 2024-05-07 NOTE — Telephone Encounter (Signed)
 Patient is requesting a call back to review CT + echo results.

## 2024-05-23 DIAGNOSIS — N182 Chronic kidney disease, stage 2 (mild): Secondary | ICD-10-CM | POA: Diagnosis not present

## 2024-05-23 DIAGNOSIS — E782 Mixed hyperlipidemia: Secondary | ICD-10-CM | POA: Diagnosis not present

## 2024-05-23 DIAGNOSIS — E113293 Type 2 diabetes mellitus with mild nonproliferative diabetic retinopathy without macular edema, bilateral: Secondary | ICD-10-CM | POA: Diagnosis not present

## 2024-05-23 DIAGNOSIS — D751 Secondary polycythemia: Secondary | ICD-10-CM | POA: Diagnosis not present

## 2024-05-23 DIAGNOSIS — I10 Essential (primary) hypertension: Secondary | ICD-10-CM | POA: Diagnosis not present

## 2024-06-07 DIAGNOSIS — R059 Cough, unspecified: Secondary | ICD-10-CM | POA: Diagnosis not present

## 2024-06-07 DIAGNOSIS — R0981 Nasal congestion: Secondary | ICD-10-CM | POA: Diagnosis not present

## 2024-06-07 DIAGNOSIS — I10 Essential (primary) hypertension: Secondary | ICD-10-CM | POA: Diagnosis not present

## 2024-06-07 DIAGNOSIS — E109 Type 1 diabetes mellitus without complications: Secondary | ICD-10-CM | POA: Diagnosis not present

## 2024-06-07 DIAGNOSIS — Z6825 Body mass index (BMI) 25.0-25.9, adult: Secondary | ICD-10-CM | POA: Diagnosis not present

## 2024-06-07 DIAGNOSIS — Z720 Tobacco use: Secondary | ICD-10-CM | POA: Diagnosis not present

## 2024-07-22 NOTE — Progress Notes (Unsigned)
 " Cardiology Office Note:    Date:  07/22/2024   ID:  Howard Cook, DOB [birth year], MRN [id number]  PCP:  Howard Lombard, MD  Cardiologist:  Howard LITTIE Nanas, MD  Electrophysiologist:  None   Referring MD: Howard Lombard, MD   No chief complaint on file.   History of Present Illness:    Howard Cook is a 69 y.o. male with a hx of diabetes, hypertension, hyperlipidemia, OSA who presents for follow-up.  Initially seen in clinic on 04/03/2024.  He was seen for chest pain in ED 01/12/2024, workup was unremarkable.    Coronary CTA 04/15/2024 showed normal coronary arteries, calcium score 0.  Echocardiogram 05/01/2024 showed normal biventricular function, moderate LVH, no significant valvular disease.  Since last clinic visit,  He reports he was feeling chest tightness that day, states that he woke up with chest pain.  Describes as pressure in center of chest, lasted for hours.  Reports he is very active, will go to the gym and do treadmill or elliptical for 30 minutes as well as weight training.  Denies symptoms with this.  Denies any dyspnea, lower extremity edema, or palpitations.  Does report some lightheadedness but denies any syncope.  He smoked for 18 years about 0.5 packs/day, quit in his early 5s.  He denies any family history of heart disease.   Past Medical History:  Diagnosis Date   Acid reflux    Chest pain    Diabetes mellitus 2003   Pt reports a corrected diagnosis to Type 1 DM (from T2DM) by Dr. Littie Caffey 3 years ago   Hyperlipidemia    Hypertension    LBP (low back pain)    Left-sided trigeminal neuralgia    Sleep apnea     Past Surgical History:  Procedure Laterality Date   ESOPHAGOGASTRIC FUNDOPLICATION  02/24/2011   HIATAL HERNIA REPAIR  23Aug2012   With NIssen Fundoplication   TONSILLECTOMY AND ADENOIDECTOMY      Current Medications: No outpatient medications have been marked as taking for the 07/25/24 encounter (Appointment) with  Cook Howard LITTIE, MD.     Allergies:   Oxycodone   Social History   Socioeconomic History   Marital status: Divorced    Spouse name: Not on file   Number of children: 2   Years of education: college   Highest education level: Bachelor's degree (e.g., BA, AB, BS)  Occupational History   Occupation: Retired  Tobacco Use   Smoking status: Former    Current packs/day: 0.00    Types: Cigarettes    Quit date: 10/26/1998    Years since quitting: 25.7   Smokeless tobacco: Never  Substance and Sexual Activity   Alcohol use: Yes    Comment: occasional use   Drug use: Never   Sexual activity: Not on file  Other Topics Concern   Not on file  Social History Narrative   Lives alone.   Right-handed.   1-2 cups caffeine daily.   Social Drivers of Health   Tobacco Use: Medium Risk (06/07/2024)   Received from [facility]   Patient History    Smoking Tobacco Use: Former    Smokeless Tobacco Use: Never    Passive Exposure: Never  Programmer, Applications: Not on file  Food Insecurity: Not on file  Transportation Needs: Not on file  Physical Activity: Not on file  Stress: Not on file  Social Connections: Not on file  Depression (EYV7-0): Not on file  Alcohol Screen: Not on file  Housing: Not on file  Utilities: Not on file  Health Literacy: Not on file     Family History: The patient's family history includes Other in his father and mother; Stroke in his brother.  ROS:   Please see the history of present illness.     All other systems reviewed and are negative.  EKGs/Labs/Other Studies Reviewed:    The following studies were reviewed today:   EKG:   01/12/2024: Normal sinus rhythm, rate 69, no ST abnormalities  Recent Labs: 01/12/2024: Hemoglobin 14.7; Platelets 149 04/03/2024: BUN 16; Creatinine, Ser 1.06; Potassium 4.3; Sodium 140  Recent Lipid Panel    Component Value Date/Time   CHOL  09/04/2009 0017    156        ATP III  CLASSIFICATION:  <200     mg/dL   Desirable  799-760  mg/dL   Borderline High  >=759    mg/dL   High          TRIG 857 09/04/2009 0017   HDL 40 09/04/2009 0017   CHOLHDL 3.9 09/04/2009 0017   VLDL 28 09/04/2009 0017   LDLCALC  09/04/2009 0017    88        Total Cholesterol/HDL:CHD Risk Coronary Heart Disease Risk Table                     Men   Women  1/2 Average Risk   3.4   3.3  Average Risk       5.0   4.4  2 X Average Risk   9.6   7.1  3 X Average Risk  23.4   11.0        Use the calculated Patient Ratio above and the CHD Risk Table to determine the patient's CHD Risk.        ATP III CLASSIFICATION (LDL):  <100     mg/dL   Optimal  899-870  mg/dL   Near or Above                    Optimal  130-159  mg/dL   Borderline  839-810  mg/dL   High  >809     mg/dL   Very High    Physical Exam:    VS:  There were no vitals taken for this visit.    Wt Readings from Last 3 Encounters:  04/03/24 180 lb 8 oz (81.9 kg)  01/12/24 177 lb (80.3 kg)  07/08/22 179 lb 9.6 oz (81.5 kg)     GEN:  Well nourished, well developed in no acute distress HEENT: Normal NECK: No JVD; No carotid bruits LYMPHATICS: No lymphadenopathy CARDIAC: RRR, no murmurs, rubs, gallops RESPIRATORY:  Clear to auscultation without rales, wheezing or rhonchi  ABDOMEN: Soft, non-tender, non-distended MUSCULOSKELETAL:  No edema; No deformity  SKIN: Warm and dry NEUROLOGIC:  Alert and oriented x 3 PSYCHIATRIC:  Normal affect   ASSESSMENT:    No diagnosis found.  PLAN:    Chest pain: Atypical in description.  Coronary CTA 04/15/2024 showed normal coronary arteries, calcium score 0.  Echocardiogram 05/01/2024 showed normal biventricular function, moderate LVH, no significant valvular disease.  Hypertension: On losartan HCTZ 100-25 mg daily.  Appears controlled  Hyperlipidemia: On rosuvastatin 40 mg daily.  LDL 18 on 09/07/23  T1DM: On insulin.  Follows with endocrinology.  A1c 9.2% on 09/07/23   RTC in  4 months***   Medication Adjustments/Labs and Tests Ordered: Current medicines are reviewed at length  with the patient today.  Concerns regarding medicines are outlined above.  No orders of the defined types were placed in this encounter.  No orders of the defined types were placed in this encounter.   There are no Patient Instructions on file for this visit.   Signed, Howard LITTIE Nanas, MD  07/22/2024 10:16 PM    [facility] "

## 2024-07-25 ENCOUNTER — Ambulatory Visit: Admitting: Cardiology

## 2024-10-25 ENCOUNTER — Ambulatory Visit: Admitting: Cardiology
